# Patient Record
Sex: Female | Born: 1960 | Race: White | Hispanic: No | Smoking: Never smoker
Health system: Southern US, Community
[De-identification: ages and names within clinical notes are randomized; demographics above are authoritative.]

## PROBLEM LIST (undated history)

## (undated) DIAGNOSIS — E785 Hyperlipidemia, unspecified: Secondary | ICD-10-CM

## (undated) DIAGNOSIS — I739 Peripheral vascular disease, unspecified: Secondary | ICD-10-CM

## (undated) DIAGNOSIS — I255 Ischemic cardiomyopathy: Secondary | ICD-10-CM

## (undated) DIAGNOSIS — I779 Disorder of arteries and arterioles, unspecified: Secondary | ICD-10-CM

## (undated) DIAGNOSIS — I10 Essential (primary) hypertension: Secondary | ICD-10-CM

## (undated) DIAGNOSIS — I2510 Atherosclerotic heart disease of native coronary artery without angina pectoris: Secondary | ICD-10-CM

## (undated) HISTORY — PX: TONSILLECTOMY: SUR1361

## (undated) HISTORY — PX: CORONARY ANGIOPLASTY WITH STENT PLACEMENT: SHX49

---

## 1999-02-20 ENCOUNTER — Other Ambulatory Visit: Admission: RE | Admit: 1999-02-20 | Discharge: 1999-02-20 | Payer: Self-pay | Admitting: Obstetrics and Gynecology

## 2000-09-21 ENCOUNTER — Other Ambulatory Visit: Admission: RE | Admit: 2000-09-21 | Discharge: 2000-09-21 | Payer: Self-pay | Admitting: Obstetrics and Gynecology

## 2001-11-02 ENCOUNTER — Other Ambulatory Visit: Admission: RE | Admit: 2001-11-02 | Discharge: 2001-11-02 | Payer: Self-pay | Admitting: *Deleted

## 2001-11-02 ENCOUNTER — Other Ambulatory Visit: Admission: RE | Admit: 2001-11-02 | Discharge: 2001-11-02 | Payer: Self-pay | Admitting: Obstetrics and Gynecology

## 2002-03-09 ENCOUNTER — Inpatient Hospital Stay (HOSPITAL_COMMUNITY): Admission: AD | Admit: 2002-03-09 | Discharge: 2002-03-09 | Payer: Self-pay | Admitting: Obstetrics & Gynecology

## 2002-03-17 ENCOUNTER — Ambulatory Visit (HOSPITAL_COMMUNITY): Admission: RE | Admit: 2002-03-17 | Discharge: 2002-03-17 | Payer: Self-pay | Admitting: Obstetrics and Gynecology

## 2002-03-28 ENCOUNTER — Inpatient Hospital Stay (HOSPITAL_COMMUNITY): Admission: AD | Admit: 2002-03-28 | Discharge: 2002-03-28 | Payer: Self-pay | Admitting: Obstetrics and Gynecology

## 2002-05-19 ENCOUNTER — Inpatient Hospital Stay (HOSPITAL_COMMUNITY): Admission: AD | Admit: 2002-05-19 | Discharge: 2002-05-22 | Payer: Self-pay | Admitting: Obstetrics and Gynecology

## 2002-07-13 ENCOUNTER — Other Ambulatory Visit: Admission: RE | Admit: 2002-07-13 | Discharge: 2002-07-13 | Payer: Self-pay | Admitting: Obstetrics and Gynecology

## 2003-12-28 ENCOUNTER — Other Ambulatory Visit: Admission: RE | Admit: 2003-12-28 | Discharge: 2003-12-28 | Payer: Self-pay | Admitting: Obstetrics and Gynecology

## 2004-08-12 ENCOUNTER — Inpatient Hospital Stay (HOSPITAL_COMMUNITY): Admission: AD | Admit: 2004-08-12 | Discharge: 2004-08-12 | Payer: Self-pay | Admitting: Obstetrics & Gynecology

## 2004-12-01 ENCOUNTER — Encounter: Admission: RE | Admit: 2004-12-01 | Discharge: 2004-12-01 | Payer: Self-pay | Admitting: Family Medicine

## 2005-12-19 ENCOUNTER — Other Ambulatory Visit: Admission: RE | Admit: 2005-12-19 | Discharge: 2005-12-19 | Payer: Self-pay | Admitting: Obstetrics and Gynecology

## 2006-02-05 ENCOUNTER — Ambulatory Visit: Payer: Self-pay | Admitting: Cardiology

## 2006-02-06 ENCOUNTER — Ambulatory Visit: Payer: Self-pay

## 2006-02-25 ENCOUNTER — Encounter: Admission: RE | Admit: 2006-02-25 | Discharge: 2006-05-26 | Payer: Self-pay | Admitting: Cardiology

## 2006-02-25 ENCOUNTER — Ambulatory Visit: Payer: Self-pay | Admitting: Cardiology

## 2006-03-03 ENCOUNTER — Ambulatory Visit: Payer: Self-pay | Admitting: Cardiology

## 2010-09-22 ENCOUNTER — Encounter: Payer: Self-pay | Admitting: Family Medicine

## 2011-01-17 NOTE — H&P (Signed)
   NAME:  Deanna Hogan, CARROL                         ACCOUNT NO.:  [id number]   MEDICAL RECORD NO.:  [id number]                   PATIENT TYPE:   LOCATION:                                       FACILITY:   PHYSICIAN:  Duke Salvia. Marcelle Overlie, M.D.            DATE OF BIRTH:  11/20/1960   DATE OF ADMISSION:  05/19/2002  DATE OF DISCHARGE:                                HISTORY & PHYSICAL   CHIEF COMPLAINT:  Hypertension at term.   HISTORY OF PRESENT ILLNESS:  A 50 year old G4, P2-0-1-2 at 17 and 6/7 weeks  presents for routine visit in the office today.  Was known to have 2+  proteinuria.  BP was 156/96 with 1+ lower extremity edema and a favorable  cervix.  Presents for labor induction.   PRENATAL COURSE:  Blood type A-.  Rubella titer is positive.  She has been  on p.o. 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  Three hour GTT was normal after a one hour GTT was slightly elevated at 144.  AST screen was negative.  Group B Strep screen was negative.   ALLERGIES:  None.   OBSTETRICAL HISTORY:  She has had SAB that required a D&C and two vaginal  deliveries.   FAMILY HISTORY:  Significant for father and grandparents with hypertension,  otherwise unremarkable.   PHYSICAL EXAMINATION:  VITAL SIGNS:  Temperature 98.2, blood pressure  156/96.  HEENT:  Unremarkable.  NECK:  Supple without mass.  LUNGS:  Clear.  CARDIOVASCULAR:  Regular rate and rhythm without murmurs, rubs, or gallops  noted.  BREASTS:  Not examined.  ABDOMEN:  Term fundal height.  Fetal heart rate 140.  PELVIC:  Cervix is 4, 90%, -2, vertex.  EXTREMITIES:  Edema 1-2+.  Reflexes 2+.  No clonus.   IMPRESSION:  1. Term intrauterine pregnancy.  2. Preeclampsia.   PLAN:  AROM induction.  Will obtain some baseline laboratory work and  consider magnesium sulfate also.     Richard M. Marcelle Overlie, M.D.    [facility]/MEDQ  D:  05/19/2002  T:  05/19/2002  Job:  [id number]

## 2011-01-17 NOTE — Discharge Summary (Signed)
NAME:  Deanna Hogan, Deanna Hogan                         ACCOUNT NO.:  [id number]   MEDICAL RECORD NO.:  [id number]                   PATIENT TYPE:  INP   LOCATION:  9146                                 FACILITY:  [facility]   PHYSICIAN:  Juluis Mire, M.D.                DATE OF BIRTH:  Jun 23, 1961   DATE OF ADMISSION:  05/19/2002  DATE OF DISCHARGE:  05/22/2002                                 DISCHARGE SUMMARY   ADMISSION DIAGNOSES:  1. Intrauterine pregnancy at 50 and six-sevenths weeks estimated gestational     age.  2. Pregnancy induced hypertension.   DISCHARGE DIAGNOSES:  1. Status post spontaneous vaginal delivery.  2. Viable female infant.  3. Second degree perineal laceration with repair.   PROCEDURE:  Spontaneous vaginal delivery.   REASON FOR ADMISSION:  Please see dictated H&P.   HOSPITAL COURSE:  The patient was admitted from the office at 71 and six-  sevenths weeks estimated gestational age with 2+ proteinuria and blood  pressure of 156/96.  Cervix was favorable and decision was made to proceed  with induction of labor.  Fetal heart tones were in the 140s with  reactivity.  Labs were drawn which revealed a platelet count of 130,000; AST  44; LDH 333; uric acid 5.5; and creatinine 0.7.  The patient was started on  magnesium sulfate.  Artificial rupture of membranes was performed, revealing  clear fluid.  Fetal heart tones remained reactive.  Pitocin augmentation was  started.  The patient had spontaneous vaginal delivery of a viable female  infant with Apgars of 8 at one minute and 9 at five minutes.  Placenta was  delivered intact.  Estimated blood loss was 400 cc.  A second degree  perineal laceration was repaired.  Magnesium sulfate was continued for 24  hours postpartally.  On postpartum day #1, the patient did not complain of a  headache or visual disturbance.  Blood pressure was 140/90s.  Fundus was  firm.  Lochia was small rubra.  Deep tendon reflexes were 2+, no clonus.  The patient did have 3+ pitting edema in the lower extremities.  PIH labs  were repeated.  Platelet count 110,000; AST 27; LDH 173; uric acid 5.7.  Magnesium sulfate was continued.  On postpartum day #2 the patient did  complain of  a sinus headache.  She denied visual disturbance.  Blood  pressure was 120/70 and other vital signs were stable.  Fundus was firm with  lochia small serosa.  The patient had had good urine output.  Platelets were  increased to 131,000  and magnesium sulfate was discontinued.  On postpartum  day #3 the patient had no symptoms of PIH.  Blood pressure was 140/70.  Vital signs were stable. Fundus was firm.  Deep tendon reflexes were 2+.  The patient was discharged home.   CONDITION ON DISCHARGE:  Good.   DIET:  Regular as tolerated.  ACTIVITY:  As tolerated.   SPECIAL INSTRUCTIONS:  The patient to call for excessive heavy vaginal  bleeding.  The patient also to call for headache unrelieved by Tylenol.   FOLLOW-UP:  The patient is to follow up in the office in two days for a  blood pressure check.  Otherwise, she is to return to the office in four to  six weeks for a postpartum check.   DISCHARGE MEDICATIONS:  1. Prenatal vitamins one p.o. daily.  2. Motrin 600 mg over-the-counter q.6h. p.r.n.     Julio Sicks, NP                          Juluis Mire, M.D.    CC/MEDQ  D:  06/20/2002  T:  06/20/2002  Job:  [id number]

## 2012-10-18 ENCOUNTER — Other Ambulatory Visit: Payer: Self-pay

## 2012-10-18 ENCOUNTER — Encounter (HOSPITAL_COMMUNITY)
Admission: EM | Disposition: A | Discharge status: Home / Self Care | Payer: Self-pay | Source: Other Acute Inpatient Hospital | Attending: Cardiovascular Disease

## 2012-10-18 ENCOUNTER — Inpatient Hospital Stay (HOSPITAL_COMMUNITY): Payer: No Typology Code available for payment source

## 2012-10-18 ENCOUNTER — Inpatient Hospital Stay (HOSPITAL_COMMUNITY)
Admission: EM | Admit: 2012-10-18 | Discharge: 2012-10-22 | DRG: 247 | Disposition: A | Discharge status: Home / Self Care | Payer: No Typology Code available for payment source | Source: Other Acute Inpatient Hospital | Attending: Cardiovascular Disease | Admitting: Cardiovascular Disease

## 2012-10-18 ENCOUNTER — Encounter (HOSPITAL_COMMUNITY): Payer: Self-pay | Admitting: Internal Medicine

## 2012-10-18 DIAGNOSIS — F329 Major depressive disorder, single episode, unspecified: Secondary | ICD-10-CM | POA: Diagnosis present

## 2012-10-18 DIAGNOSIS — Y849 Medical procedure, unspecified as the cause of abnormal reaction of the patient, or of later complication, without mention of misadventure at the time of the procedure: Secondary | ICD-10-CM | POA: Diagnosis not present

## 2012-10-18 DIAGNOSIS — E785 Hyperlipidemia, unspecified: Secondary | ICD-10-CM

## 2012-10-18 DIAGNOSIS — G4440 Drug-induced headache, not elsewhere classified, not intractable: Secondary | ICD-10-CM | POA: Diagnosis not present

## 2012-10-18 DIAGNOSIS — I2510 Atherosclerotic heart disease of native coronary artery without angina pectoris: Secondary | ICD-10-CM

## 2012-10-18 DIAGNOSIS — Z79899 Other long term (current) drug therapy: Secondary | ICD-10-CM

## 2012-10-18 DIAGNOSIS — IMO0002 Reserved for concepts with insufficient information to code with codable children: Secondary | ICD-10-CM | POA: Diagnosis not present

## 2012-10-18 DIAGNOSIS — I2109 ST elevation (STEMI) myocardial infarction involving other coronary artery of anterior wall: Secondary | ICD-10-CM

## 2012-10-18 DIAGNOSIS — Z8249 Family history of ischemic heart disease and other diseases of the circulatory system: Secondary | ICD-10-CM

## 2012-10-18 DIAGNOSIS — I10 Essential (primary) hypertension: Secondary | ICD-10-CM

## 2012-10-18 DIAGNOSIS — F3289 Other specified depressive episodes: Secondary | ICD-10-CM | POA: Diagnosis present

## 2012-10-18 DIAGNOSIS — Y9210 Unspecified residential institution as the place of occurrence of the external cause: Secondary | ICD-10-CM | POA: Diagnosis not present

## 2012-10-18 DIAGNOSIS — T463X5A Adverse effect of coronary vasodilators, initial encounter: Secondary | ICD-10-CM | POA: Diagnosis not present

## 2012-10-18 DIAGNOSIS — I213 ST elevation (STEMI) myocardial infarction of unspecified site: Secondary | ICD-10-CM

## 2012-10-18 DIAGNOSIS — I6529 Occlusion and stenosis of unspecified carotid artery: Secondary | ICD-10-CM | POA: Diagnosis present

## 2012-10-18 DIAGNOSIS — T40605A Adverse effect of unspecified narcotics, initial encounter: Secondary | ICD-10-CM | POA: Diagnosis not present

## 2012-10-18 DIAGNOSIS — I252 Old myocardial infarction: Secondary | ICD-10-CM

## 2012-10-18 DIAGNOSIS — I219 Acute myocardial infarction, unspecified: Secondary | ICD-10-CM

## 2012-10-18 DIAGNOSIS — Z955 Presence of coronary angioplasty implant and graft: Secondary | ICD-10-CM

## 2012-10-18 DIAGNOSIS — L299 Pruritus, unspecified: Secondary | ICD-10-CM | POA: Diagnosis not present

## 2012-10-18 DIAGNOSIS — E876 Hypokalemia: Secondary | ICD-10-CM | POA: Diagnosis present

## 2012-10-18 DIAGNOSIS — I2589 Other forms of chronic ischemic heart disease: Secondary | ICD-10-CM | POA: Diagnosis present

## 2012-10-18 HISTORY — DX: Peripheral vascular disease, unspecified: I73.9

## 2012-10-18 HISTORY — PX: LEFT HEART CATHETERIZATION WITH CORONARY ANGIOGRAM: SHX5451

## 2012-10-18 HISTORY — DX: Disorder of arteries and arterioles, unspecified: I77.9

## 2012-10-18 HISTORY — DX: Hyperlipidemia, unspecified: E78.5

## 2012-10-18 HISTORY — DX: Atherosclerotic heart disease of native coronary artery without angina pectoris: I25.10

## 2012-10-18 HISTORY — DX: Ischemic cardiomyopathy: I25.5

## 2012-10-18 HISTORY — DX: Essential (primary) hypertension: I10

## 2012-10-18 HISTORY — PX: PERCUTANEOUS CORONARY STENT INTERVENTION (PCI-S): SHX5485

## 2012-10-18 LAB — LIPID PANEL
LDL Cholesterol: 110 mg/dL — ABNORMAL HIGH (ref 0–99)
Triglycerides: 49 mg/dL (ref <150)
VLDL: 10 mg/dL (ref 0–40)

## 2012-10-18 LAB — CBC WITH DIFFERENTIAL/PLATELET
Basophils Absolute: 0 10*3/uL (ref 0.0–0.1)
Basophils Relative: 0 % (ref 0–1)
Eosinophils Relative: 0 % (ref 0–5)
HCT: 41.3 % (ref 36.0–46.0)
Hemoglobin: 14.2 g/dL (ref 12.0–15.0)
MCH: 30.7 pg (ref 26.0–34.0)
MCHC: 34.4 g/dL (ref 30.0–36.0)
MCV: 89.4 fL (ref 78.0–100.0)
Monocytes Absolute: 0.3 10*3/uL (ref 0.1–1.0)
Monocytes Relative: 3 % (ref 3–12)
Neutro Abs: 8.5 10*3/uL — ABNORMAL HIGH (ref 1.7–7.7)
RDW: 14.2 % (ref 11.5–15.5)

## 2012-10-18 LAB — CK TOTAL AND CKMB (NOT AT ARMC)
CK, MB: 3.8 ng/mL (ref 0.3–4.0)
Total CK: 144 U/L (ref 7–177)

## 2012-10-18 LAB — COMPREHENSIVE METABOLIC PANEL
ALT: 19 U/L (ref 0–35)
AST: 21 U/L (ref 0–37)
AST: 26 U/L (ref 0–37)
Albumin: 3.7 g/dL (ref 3.5–5.2)
Albumin: 3.6 g/dL (ref 3.5–5.2)
Alkaline Phosphatase: 81 U/L (ref 39–117)
CO2: 27 mEq/L (ref 19–32)
CO2: 26 mEq/L (ref 19–32)
Chloride: 99 mEq/L (ref 96–112)
Creatinine, Ser: 0.69 mg/dL (ref 0.50–1.10)
GFR calc non Af Amer: >90 mL/min (ref >90)
GFR calc non Af Amer: >90 mL/min (ref >90)
Glucose, Bld: 112 mg/dL — ABNORMAL HIGH (ref 70–99)
Potassium: 3.2 mEq/L — ABNORMAL LOW (ref 3.5–5.1)
Total Bilirubin: 0.3 mg/dL (ref 0.3–1.2)
Total Bilirubin: 0.3 mg/dL (ref 0.3–1.2)

## 2012-10-18 LAB — POCT I-STAT, CHEM 8
Glucose, Bld: 113 mg/dL — ABNORMAL HIGH (ref 70–99)
HCT: 44 % (ref 36.0–46.0)
Hemoglobin: 15 g/dL (ref 12.0–15.0)
Potassium: 3.2 mEq/L — ABNORMAL LOW (ref 3.5–5.1)
Sodium: 139 mEq/L (ref 135–145)

## 2012-10-18 LAB — CBC
HCT: 40 % (ref 36.0–46.0)
MCH: 30.8 pg (ref 26.0–34.0)
MCV: 88.7 fL (ref 78.0–100.0)
RDW: 14.1 % (ref 11.5–15.5)
WBC: 7.1 10*3/uL (ref 4.0–10.5)

## 2012-10-18 LAB — MRSA PCR SCREENING: MRSA by PCR: POSITIVE — AB

## 2012-10-18 LAB — POCT ACTIVATED CLOTTING TIME
Activated Clotting Time: 519 seconds
Activated Clotting Time: 192 seconds

## 2012-10-18 LAB — PROTIME-INR: Prothrombin Time: 13 seconds (ref 11.6–15.2)

## 2012-10-18 LAB — TROPONIN I: Troponin I: 0.41 ng/mL (ref <0.30)

## 2012-10-18 SURGERY — LEFT HEART CATHETERIZATION WITH CORONARY ANGIOGRAM
Anesthesia: LOCAL

## 2012-10-18 MED ORDER — BIVALIRUDIN 250 MG IV SOLR
INTRAVENOUS | Status: AC
  Filled 2012-10-18: qty 250

## 2012-10-18 MED ORDER — MIDAZOLAM HCL 2 MG/2ML IJ SOLN
INTRAMUSCULAR | Status: AC
  Filled 2012-10-18: qty 2

## 2012-10-18 MED ORDER — ASPIRIN 81 MG PO CHEW
81.0000 mg | CHEWABLE_TABLET | Freq: Every day | ORAL | Status: DC
  Administered 2012-10-18 – 2012-10-22 (×5): 81 mg via ORAL
  Filled 2012-10-18 (×5): qty 1

## 2012-10-18 MED ORDER — SODIUM CHLORIDE 0.9 % IJ SOLN
3.0000 mL | Freq: Two times a day (BID) | INTRAMUSCULAR | Status: DC
  Administered 2012-10-18 – 2012-10-21 (×6): 3 mL via INTRAVENOUS

## 2012-10-18 MED ORDER — MORPHINE SULFATE 2 MG/ML IJ SOLN
2.0000 mg | INTRAMUSCULAR | Status: DC | PRN
  Administered 2012-10-18: 2 mg via INTRAVENOUS
  Filled 2012-10-18: qty 1

## 2012-10-18 MED ORDER — VERAPAMIL HCL 2.5 MG/ML IV SOLN
INTRAVENOUS | Status: AC
  Filled 2012-10-18: qty 2

## 2012-10-18 MED ORDER — SODIUM CHLORIDE 0.9 % IV SOLN: INTRAVENOUS | Status: DC

## 2012-10-18 MED ORDER — FENTANYL CITRATE 0.05 MG/ML IJ SOLN
INTRAMUSCULAR | Status: AC
  Filled 2012-10-18: qty 2

## 2012-10-18 MED ORDER — DIPHENHYDRAMINE HCL 50 MG/ML IJ SOLN
INTRAMUSCULAR | Status: AC
  Filled 2012-10-18: qty 1

## 2012-10-18 MED ORDER — DIPHENHYDRAMINE HCL 50 MG/ML IJ SOLN
50.0000 mg | Freq: Once | INTRAMUSCULAR | Status: AC
  Administered 2012-10-18: 50 mg via INTRAVENOUS

## 2012-10-18 MED ORDER — NITROGLYCERIN 0.4 MG SL SUBL: 0.4000 mg | SUBLINGUAL_TABLET | SUBLINGUAL | Status: DC | PRN

## 2012-10-18 MED ORDER — HYDROCODONE-ACETAMINOPHEN 5-325 MG PO TABS
1.0000 | ORAL_TABLET | ORAL | Status: DC | PRN
  Administered 2012-10-20 – 2012-10-21 (×3): 1 via ORAL
  Filled 2012-10-18 (×3): qty 1

## 2012-10-18 MED ORDER — CARVEDILOL 3.125 MG PO TABS
3.1250 mg | ORAL_TABLET | Freq: Two times a day (BID) | ORAL | Status: DC
  Administered 2012-10-18 – 2012-10-20 (×4): 3.125 mg via ORAL
  Filled 2012-10-18 (×6): qty 1

## 2012-10-18 MED ORDER — PRASUGREL HCL 10 MG PO TABS
10.0000 mg | ORAL_TABLET | Freq: Every day | ORAL | Status: DC
  Administered 2012-10-19: 10 mg via ORAL
  Filled 2012-10-18: qty 1

## 2012-10-18 MED ORDER — HEPARIN (PORCINE) IN NACL 2-0.9 UNIT/ML-% IJ SOLN
INTRAMUSCULAR | Status: AC
  Filled 2012-10-18: qty 1000

## 2012-10-18 MED ORDER — PRASUGREL HCL 10 MG PO TABS
ORAL_TABLET | ORAL | Status: AC
  Filled 2012-10-18: qty 6

## 2012-10-18 MED ORDER — SODIUM CHLORIDE 0.9 % IV SOLN
0.2500 mg/kg/h | INTRAVENOUS | Status: AC
  Filled 2012-10-18: qty 250

## 2012-10-18 MED ORDER — SODIUM CHLORIDE 0.9 % IV SOLN: 250.0000 mL | INTRAVENOUS | Status: DC | PRN

## 2012-10-18 MED ORDER — ATORVASTATIN CALCIUM 80 MG PO TABS
80.0000 mg | ORAL_TABLET | Freq: Every day | ORAL | Status: DC
  Administered 2012-10-18 – 2012-10-21 (×4): 80 mg via ORAL
  Filled 2012-10-18 (×5): qty 1

## 2012-10-18 MED ORDER — NITROGLYCERIN IN D5W 200-5 MCG/ML-% IV SOLN
2.0000 ug/min | INTRAVENOUS | Status: DC
  Administered 2012-10-18: 10 ug/min via INTRAVENOUS
  Filled 2012-10-18: qty 250

## 2012-10-18 MED ORDER — SODIUM CHLORIDE 0.9 % IJ SOLN: 3.0000 mL | INTRAMUSCULAR | Status: DC | PRN

## 2012-10-18 MED ORDER — POTASSIUM CHLORIDE CRYS ER 20 MEQ PO TBCR
40.0000 meq | EXTENDED_RELEASE_TABLET | Freq: Once | ORAL | Status: AC
  Administered 2012-10-18: 40 meq via ORAL
  Filled 2012-10-18: qty 2

## 2012-10-18 MED ORDER — ASPIRIN EC 81 MG PO TBEC: 81.0000 mg | DELAYED_RELEASE_TABLET | Freq: Every day | ORAL | Status: DC

## 2012-10-18 MED ORDER — ONDANSETRON HCL 4 MG/2ML IJ SOLN: 4.0000 mg | Freq: Four times a day (QID) | INTRAMUSCULAR | Status: DC | PRN

## 2012-10-18 MED ORDER — ACETAMINOPHEN 325 MG PO TABS
650.0000 mg | ORAL_TABLET | ORAL | Status: DC | PRN
  Administered 2012-10-20: 650 mg via ORAL
  Filled 2012-10-18: qty 2

## 2012-10-18 MED ORDER — LIDOCAINE HCL (PF) 1 % IJ SOLN
INTRAMUSCULAR | Status: AC
  Filled 2012-10-18: qty 30

## 2012-10-18 MED ORDER — SODIUM CHLORIDE 0.9 % IJ SOLN
3.0000 mL | Freq: Two times a day (BID) | INTRAMUSCULAR | Status: DC
  Administered 2012-10-18 – 2012-10-21 (×8): 3 mL via INTRAVENOUS

## 2012-10-18 NOTE — Progress Notes (Signed)
Pt continues to have same amount of chest tightness after starting iv ntg.   Pt states she does not want ntg increased.  Because she does not want a headache.  Also refuses iv morphine.

## 2012-10-18 NOTE — CV Procedure (Signed)
Cardiac Catheterization Procedure Note  Name: Deanna Hogan MRN: [id number] DOB: 1961/08/31  Procedure: Left Heart Cath, Selective Coronary Angiography, LV angiography, PTCA and stenting of the LAD  Indication: Anterior MI  Procedural Details:  The right wrist was prepped, draped, and anesthetized with 1% lidocaine. Using the modified Seldinger technique, a 6 French sheath was introduced into the right radial artery. 3 mg of verapamil was administered through the sheath, weight-based unfractionated heparin was administered intravenously. Standard Judkins catheters were used for selective coronary angiography and left ventriculography. Catheter exchanges were performed over an exchange length guidewire. The LCA was imaged with an XB-LAD 3.5 cm guide catheter.  PROCEDURAL FINDINGS Hemodynamics: AO 118/72 LV 112/14   Coronary angiography: Coronary dominance: right  Left mainstem: Widely patent without significant stenosis.  Left anterior descending (LAD): The LAD is severely diseased just after the first septal perforator. The proximal vessel has minor luminal irregularity. The first diagonal is diffusely diseased and it is a small-caliber vessel. The LAD just before the first diagonal origin has a 90% eccentric stenosis. There is diffuse disease throughout the mid LAD extending from the first of the second diagonal. The second diagonal origin is patent. The mid LAD has diffuse 80% stenosis. The distal LAD is relatively small in caliber but it appears smooth and nondiseased.  Left circumflex (LCx): The left circumflex is patent. It supplies a single large obtuse marginal branch. There is no significant stenosis identified.  Right coronary artery (RCA): Widely patent throughout. This is a large, dominant vessel. There are no significant stenoses throughout the proximal, mid, or distal RCA. The PDA branch is patent with moderate distal vessel disease noted. There is a 70% stenosis in the  distal PDA. The posterolateral branches are small with no significant disease. There are total of 3 PLA branches.  Left ventriculography: Left ventricular function is severely depressed. There is akinesis of the entire periapical region. The LVEF is estimated at 35%. There is no significant mitral regurgitation.  PCI Note:  Following the diagnostic procedure, the decision was made to proceed with PCI.  Weight-based bivalirudin was given for anticoagulation. She was loaded with effient 60 mg. Once a therapeutic ACT was achieved, a 6 [place] XB LAD guide catheter was inserted.  A cougar coronary guidewire was used to cross the lesion.  The lesion was predilated with a 2.5 x 20 mm balloon.  The lesion was then stented with a 2.5 x 32 mm promus premier drug-eluting stent.  The stent was postdilated with a 2.75 x 20 mm noncompliant balloon.  There is severe disease of the distal edge of the stent. The stented segment had to be extended with a 2.25 x 16 mm Promus premier drug-eluting stent. Initially the stent would not pass and I postdilated at with a 2.75 mm noncompliant balloon. Following post dilatation I was able to extend the second stent through the first stented segment. The stent was deployed in overlapping fashion. The stent balloon was pulled back and dilated to 18 atmospheres over the overlapped segment. Following PCI, there was 0% residual stenosis and TIMI-3 flow. Final angiography confirmed an excellent result. The patient tolerated the procedure well. There were no immediate procedural complications. A TR band was used for radial hemostasis. The patient was transferred to the post catheterization recovery area for further monitoring.  PCI Data: Vessel - LAD/Segment - mid Percent Stenosis (pre)  90 TIMI-flow 3 Stent 2.5 x 32 mm and 2.25 x 16 mm Promus premier DES Percent Stenosis (  post) 0 TIMI-flow (post) 3  Final Conclusions:   1. 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 Severe segmental LV dysfunction   Recommendations:  The patient will receive dual antiplatelet therapy for at least 12 months. She should receive post MI medical therapy and will be monitored carefully in the cardiac ICU. I would recommend an echocardiogram before she is discharged from the hospital. I am optimistic that her LV dysfunction is related to myocardial stunning because of the short duration of chest pain and TIMI-3 flow on arrival. Hopefully she will have early improvement of LV function.  Tonny Bollman 10/18/2012, 4:12 PM

## 2012-10-18 NOTE — Progress Notes (Signed)
Pt c/o itching all over after the morphine.  Called and notified. Dr. Chase Picket.  Orders received for benadryl.  Also pt c/o headache.  Orders received for lortab.

## 2012-10-18 NOTE — Progress Notes (Signed)
Pt c/o some chest tightness/  ekg done.  Called and notified chris berge.  Also notified of k 3.2.  Ordered received.

## 2012-10-18 NOTE — Progress Notes (Signed)
Utilization Review Completed.[place] T2/17/2014  

## 2012-10-18 NOTE — H&P (Signed)
History and Physical   Patient ID: Deanna Hogan MRN: [id number], DOB/AGE: May 21, 1961 52 y.o. Date of Encounter: 10/18/2012  Primary Physician: Default, Provider, MD Primary Cardiologist:    Chief Complaint: Chest pain  HPI:   Patient is 52 yo lady with PMH HTN and HLD, who was admitted due to chest pain.   Her chest pain started at about one hour ago. She was evaluated in the ER of [facility]. She was found to have ST elevation in V2 to V4 in ER. After treated with ASA, nitroglycerine and bolus heparin, she was transferred to [facility] for urgent cardiac cath. Upon arrival to cath lab, patient is chest pain free, no SOB or palpitation. She is oriented and hemodynamically stable. No pain or swelling in calf areas.  Denies fever, chills, headaches,  Cough, SOB,  abdominal pain,diarrhea, constipation, dysuria, urgency, frequency, hematuria.    Past Medical History  Diagnosis Date  . HTN (hypertension)   . HLD (hyperlipidemia)     Family History  Problem Relation Age of Onset  . Heart attack Father 17    Surgical History: none   I have reviewed the patient's current medications. Prior to Admission medications   Not on File   Scheduled Meds: Continuous Infusions: PRN Meds:.    Allergies: NKA  History   Social History  . Marital Status: Married    Spouse Name: N/A    Number of Children: N/A  . Years of Education: N/A   Occupational History  . Not on file.   Social History Main Topics  . Smoking status: Not on file  . Smokeless tobacco: Not on file  . Alcohol Use: Not on file  . Drug Use: Not on file  . Sexually Active: Not on file   Other Topics Concern  . Not on file   Social History Narrative  . No narrative on file     Family History  Problem Relation Age of Onset  . Heart attack Father 1   No family status information on file.    Review of Systems:   Full 14-point review of systems otherwise negative except as noted  above.  There were no vitals filed for this visit.   Physical Exam:  Vital sign: Bp 150/90 mmHg, HR 68/min  General: resting in bed, not in acute distress HEENT: PERRL, EOMI, no scleral icterus Cardiac: S1/S2, RRR, No murmurs, gallops or rubs Pulm: Good air movement bilaterally, Clear to auscultation bilaterally, No rales, wheezing, rhonchi or rubs. Abd: Soft,  nondistended, nontender, no rebound pain, no organomegaly, BS present Ext: No rashes or edema, 2+DP/PT pulse bilaterally Musculoskeletal: No joint deformities, erythema, or stiffness, ROM full and no nontender Skin: no rashes. No skin bruise. Neuro: alert and oriented X3, cranial nerves II-XII grossly intact, muscle strength 5/5 in all extremeties,  sensation to light touch intact.  Psych.: patient is not psychotic, no suicidal or hemocidal ideation.  ECG: sinus rhythm, regular, normal Axis, nomral R progression, ST elevation in V2 to V4 with reciprocal ST depression in III and aVF.   Assessment and plan:  Patient is 52 yo lady with PMH HTN and HLD, who presents with chest pain. EKG shows ST elevation in V2 to V4 with reciprocal ST depression in III and aVF. She is oriented and hemodynamically stable upon arrival.   #: STEMI:  Patient's chest pain is most likely due to STEMI. She has three risk factors including HTN, HLD and family risk factor (father had heart  attach at age of 5s). Her EKG has ST elevation in precordial leads V2 to V4. Other differential diagnoses includes, but less likely aortic dissection (less likely, no typical tearing chest pain radiating to the back), PE (unlikely, no sign of DVT, Well's score is low probability), pneumothorax (unlikely, no SOB and normal lung sound bilaterally), esophageal perforation (unlikely, no dysphagia or recent hx of endoscopy) and pneumonia (unlikely, no cough, fever or chill).  Plan:  - Admit to CCU and urgent cardiac cath  - Dual antiplatelet mediations after cardiac cath: ASA  and Effient - Nitroglycerin prn, Morphine prn, coreg, Lipitor - Risk factor stratification: A1c, TSH and lipid profile. - Repeat her EKG in the am - she may need 2D echo 2 days after cardiac cath - CXR - Lab: CMP, CBC with diff, troponin  # HTN: She has been on Hyzaar (losartan/HCTZ) at home, but the dosage is not clear.current bp is 150/ 90 mmHg. - may switch to ACEI or Prinzid if renal function is OK  #: HLD: no data about LDL level.  -will be on lipitor -will check lipid profile  #:  DVT PPx: SCD   Lorretta Harp, MD PGY2, Internal Medicine Teaching Service Pager: [phone]  Patient seen, examined. Available data reviewed. Agree with findings, assessment, and plan as outlined by Dr Verdie Mosher. Pt's exam shows a pleasant woman in NAD. Heart is RRR without murmur, lungs are clear, and there is no peripheral edema. Radial pulse is strong with positive Allen's test. EKG shows anterior STEMI. Reviewed risks/indication/alternatives to emergency cath and PCI. She understands and agrees to proceed. Pt consented to Stentys Apposition Trial. Further plans pending cath results.  Tonny Bollman, M.D. 10/18/2012 4:09 PM

## 2012-10-19 ENCOUNTER — Encounter (HOSPITAL_COMMUNITY)
Admission: EM | Disposition: A | Discharge status: Home / Self Care | Payer: Self-pay | Source: Other Acute Inpatient Hospital | Attending: Cardiovascular Disease

## 2012-10-19 DIAGNOSIS — I219 Acute myocardial infarction, unspecified: Secondary | ICD-10-CM

## 2012-10-19 DIAGNOSIS — I2510 Atherosclerotic heart disease of native coronary artery without angina pectoris: Secondary | ICD-10-CM

## 2012-10-19 HISTORY — PX: LEFT HEART CATHETERIZATION WITH CORONARY ANGIOGRAM: SHX5451

## 2012-10-19 LAB — BASIC METABOLIC PANEL
BUN: 7 mg/dL (ref 6–23)
CO2: 30 mEq/L (ref 19–32)
Calcium: 8.8 mg/dL (ref 8.4–10.5)
Glucose, Bld: 80 mg/dL (ref 70–99)
Sodium: 140 mEq/L (ref 135–145)

## 2012-10-19 LAB — CBC
Hemoglobin: 13.3 g/dL (ref 12.0–15.0)
MCH: 30.4 pg (ref 26.0–34.0)
MCHC: 33.9 g/dL (ref 30.0–36.0)
MCV: 89.5 fL (ref 78.0–100.0)
Platelets: 211 10*3/uL (ref 150–400)
RBC: 4.38 MIL/uL (ref 3.87–5.11)

## 2012-10-19 LAB — TROPONIN I: Troponin I: 0.87 ng/mL (ref <0.30)

## 2012-10-19 LAB — HEMOGLOBIN A1C: Hgb A1c MFr Bld: 5.9 % — ABNORMAL HIGH (ref <5.7)

## 2012-10-19 SURGERY — LEFT HEART CATHETERIZATION WITH CORONARY ANGIOGRAM
Anesthesia: LOCAL

## 2012-10-19 MED ORDER — DIAZEPAM 5 MG PO TABS
ORAL_TABLET | ORAL | Status: AC
  Administered 2012-10-19: 10 mg
  Filled 2012-10-19: qty 2

## 2012-10-19 MED ORDER — HEPARIN SODIUM (PORCINE) 1000 UNIT/ML IJ SOLN
INTRAMUSCULAR | Status: AC
  Filled 2012-10-19: qty 1

## 2012-10-19 MED ORDER — SODIUM CHLORIDE 0.9 % IV SOLN: 1.0000 mL/kg/h | INTRAVENOUS | Status: AC

## 2012-10-19 MED ORDER — LOSARTAN POTASSIUM 50 MG PO TABS
50.0000 mg | ORAL_TABLET | Freq: Every day | ORAL | Status: DC
  Administered 2012-10-19 – 2012-10-20 (×2): 50 mg via ORAL
  Filled 2012-10-19 (×2): qty 1

## 2012-10-19 MED ORDER — DESVENLAFAXINE SUCCINATE ER 100 MG PO TB24: 100.0000 mg | ORAL_TABLET | Freq: Every day | ORAL | Status: DC

## 2012-10-19 MED ORDER — SODIUM CHLORIDE 0.9 % IV SOLN: 250.0000 mL | INTRAVENOUS | Status: DC | PRN

## 2012-10-19 MED ORDER — DIAZEPAM 5 MG PO TABS
10.0000 mg | ORAL_TABLET | ORAL | Status: AC
  Administered 2012-10-19: 10 mg via ORAL

## 2012-10-19 MED ORDER — SODIUM CHLORIDE 0.9 % IJ SOLN: 3.0000 mL | INTRAMUSCULAR | Status: DC | PRN

## 2012-10-19 MED ORDER — SODIUM CHLORIDE 0.9 % IV SOLN: 1.0000 mL/kg/h | INTRAVENOUS | Status: DC

## 2012-10-19 MED ORDER — VERAPAMIL HCL 2.5 MG/ML IV SOLN
INTRAVENOUS | Status: AC
  Filled 2012-10-19: qty 2

## 2012-10-19 MED ORDER — CLOPIDOGREL BISULFATE 75 MG PO TABS
75.0000 mg | ORAL_TABLET | Freq: Every day | ORAL | Status: DC
  Administered 2012-10-20 – 2012-10-22 (×3): 75 mg via ORAL
  Filled 2012-10-19 (×4): qty 1

## 2012-10-19 MED ORDER — SODIUM CHLORIDE 0.9 % IJ SOLN: 3.0000 mL | Freq: Two times a day (BID) | INTRAMUSCULAR | Status: DC

## 2012-10-19 MED ORDER — NITROGLYCERIN 1 MG/10 ML FOR IR/CATH LAB
INTRA_ARTERIAL | Status: AC
  Filled 2012-10-19: qty 20

## 2012-10-19 MED ORDER — HEPARIN (PORCINE) IN NACL 2-0.9 UNIT/ML-% IJ SOLN
INTRAMUSCULAR | Status: AC
  Filled 2012-10-19: qty 1000

## 2012-10-19 MED ORDER — MIDAZOLAM HCL 2 MG/2ML IJ SOLN
INTRAMUSCULAR | Status: AC
  Filled 2012-10-19: qty 2

## 2012-10-19 MED ORDER — DESVENLAFAXINE SUCCINATE ER 100 MG PO TB24
100.0000 mg | ORAL_TABLET | Freq: Every day | ORAL | Status: DC
  Administered 2012-10-19 – 2012-10-21 (×3): 100 mg via ORAL
  Filled 2012-10-19: qty 1
  Filled 2012-10-19: qty 2
  Filled 2012-10-19: qty 1
  Filled 2012-10-19 (×2): qty 2

## 2012-10-19 MED ORDER — MAGNESIUM HYDROXIDE 400 MG/5ML PO SUSP
30.0000 mL | Freq: Every day | ORAL | Status: DC | PRN
  Administered 2012-10-19 – 2012-10-21 (×2): 30 mL via ORAL
  Filled 2012-10-19 (×2): qty 30

## 2012-10-19 MED ORDER — POTASSIUM CHLORIDE CRYS ER 20 MEQ PO TBCR
40.0000 meq | EXTENDED_RELEASE_TABLET | ORAL | Status: AC
  Administered 2012-10-19 (×2): 40 meq via ORAL
  Filled 2012-10-19 (×2): qty 1
  Filled 2012-10-19: qty 2

## 2012-10-19 MED ORDER — LIDOCAINE HCL (PF) 1 % IJ SOLN
INTRAMUSCULAR | Status: AC
  Filled 2012-10-19: qty 30

## 2012-10-19 MED ORDER — FENTANYL CITRATE 0.05 MG/ML IJ SOLN
INTRAMUSCULAR | Status: AC
  Filled 2012-10-19: qty 2

## 2012-10-19 MED FILL — Dextrose Inj 5%: INTRAVENOUS | Qty: 50 | Status: AC

## 2012-10-19 NOTE — Interval H&P Note (Signed)
History and Physical Interval Note:  10/19/2012 11:30 AM  Deanna Hogan  has presented today for surgery, with the diagnosis of relook/cp  The various methods of treatment have been discussed with the patient and family. After consideration of risks, benefits and other options for treatment, the patient has consented to  Procedure(s): LEFT HEART CATHETERIZATION WITH CORONARY ANGIOGRAM (N/A) as a surgical intervention .  The patient's history has been reviewed, patient examined, no change in status, stable for surgery.  I have reviewed the patient's chart and labs.  Questions were answered to the patient's satisfaction.     Theron Arista [facility] 10/19/2012 11:30 AM

## 2012-10-19 NOTE — Plan of Care (Signed)
Problem: Phase I Progression Outcomes Goal: Post Cath/PCI return to appropriate Path Outcome: Not Met (add Reason) Patient continues to have chest discomfort post cath/PCI.  Returned to cath lab for evaluation/possible additional PCI.

## 2012-10-19 NOTE — Progress Notes (Signed)
Patient Name: Deanna Hogan Date of Encounter: 10/19/2012     Active Problems:   STEMI (ST elevation myocardial infarction)   HTN (hypertension)   HLD (hyperlipidemia)    SUBJECTIVE  - Patient had some chest tightness last evening. She had itches after received morphine, which resolved after benadryl. Patient also reports mild headache after NG. Currently, she does not have cheat pain, SOB or palpitation. - LDL 93, HDL 51, TSH 2.653, A1c 5.9,  - K=3.0, Cre normal,  - Troponin: 0.41-->0.61 - Tel: sinus rhythm, HR is around 55/min - ECG: Delayed R progression, T inversion in V2 to V6.   CURRENT MEDS Scheduled Meds: . aspirin  81 mg Oral Daily  . atorvastatin  80 mg Oral q1800  . carvedilol  3.125 mg Oral BID WC  . prasugrel  10 mg Oral Daily  . sodium chloride  3 mL Intravenous Q12H  . sodium chloride  3 mL Intravenous Q12H   Continuous Infusions: . nitroGLYCERIN 10 mcg/min (10/18/12 2300)   PRN Meds:.sodium chloride, acetaminophen, HYDROcodone-acetaminophen, morphine injection, nitroGLYCERIN, ondansetron (ZOFRAN) IV, sodium chloride  OBJECTIVE  Filed Vitals:   10/19/12 0400 10/19/12 0407 10/19/12 0500 10/19/12 0600  BP: 86/45 99/49 113/67 128/86  Pulse: 59 61 54 55  Temp: 97.6 F (36.4 C)     Resp: 17 17 12 13   Height:      Weight:  194 lb 10.7 oz (88.3 kg)    SpO2: 95% 97% 99% 99%    Intake/Output Summary (Last 24 hours) at 10/19/12 0709 Last data filed at 10/18/12 2300  Gross per 24 hour  Intake 743.05 ml  Output      0 ml  Net 743.05 ml   CVP:  Filed Weights   10/18/12 1500 10/18/12 1630 10/19/12 0407  Weight: 196 lb 3.4 oz (89 kg) 198 lb 6.6 oz (90 kg) 194 lb 10.7 oz (88.3 kg)    PHYSICAL EXAM  General: Pleasant, NAD. Neuro: Alert and oriented X 3. Moves all extremities spontaneously. Psych: Normal affect. HEENT:  Normal  Neck: Supple without bruits or JVD. Lungs:  Resp regular and unlabored, CTA. Heart: RRR no s3, s4, or  murmurs. Abdomen: Soft, non-tender, non-distended, BS+ Extremities: No clubbing, cyanosis or edema. DP/PT/Radials 2+ and equal bilaterally.  Accessory Clinical Findings  CBC  Recent Labs  10/18/12 1510 10/18/12 1722 10/19/12 0520  WBC  --  10.7* 6.5  NEUTROABS  --  8.5*  --   HGB 15.0 14.2 13.3  HCT 44.0 41.3 39.2  MCV  --  89.4 89.5  PLT  --  213 211   Basic Metabolic Panel  Recent Labs  10/18/12 1510 10/18/12 1722 10/19/12 0520  NA 139 138 140  K 3.2* 3.2* 3.0*  CL 98 99 101  CO2  --  26 30  GLUCOSE 113* 83 80  BUN 7 9 7   CREATININE 0.90 0.69 0.82  CALCIUM  --  9.2 8.8  MG  --  1.9  --   PHOS  --  3.6  --      Recent Labs Lab 10/18/12 1505 10/18/12 1510 10/18/12 1722 10/19/12 0520  NA 137 139 138 140  K 3.1* 3.2* 3.2* 3.0*  CL 98 98 99 101  CO2 27  --  26 30  GLUCOSE 112* 113* 83 80  BUN 9 7 9 7   CREATININE 0.71 0.90 0.69 0.82  CALCIUM 9.0  --  9.2 8.8  MG  --   --  1.9  --  PHOS  --   --  3.6  --      Liver Function Tests  Recent Labs  10/18/12 1505 10/18/12 1722  AST 21 26  ALT 18 19  ALKPHOS 81 81  BILITOT 0.3 0.3  PROT 6.9 6.8  ALBUMIN 3.7 3.6   No results found for this basename: LIPASE, AMYLASE,  in the last 72 hours Cardiac Enzymes  Recent Labs  10/18/12 1505 10/18/12 1722  CKTOTAL 144  --   CKMB 3.8  --   TROPONINI 0.41* 0.61*   BNP No components found with this basename: POCBNP,  D-Dimer No results found for this basename: DDIMER,  in the last 72 hours Hemoglobin A1C  Recent Labs  10/18/12 1505  HGBA1C 5.9*   Fasting Lipid Panel  Recent Labs  10/19/12 0520  CHOL 173  HDL 51  LDLCALC 93  TRIG 144  CHOLHDL 3.4   Thyroid Function Tests  Recent Labs  10/18/12 1722  TSH 2.653    TELE: sinus rhythm, HR around 55/min  ECG: sinus rhythm, regular, normal Axis, delayed R progression, T inversion in V2 to V6.   2D-Echo: none  Radiology/Studies Portable Chest 1 View  10/18/2012  *RADIOLOGY REPORT*   Clinical Data: Chest pain  PORTABLE CHEST - 1 VIEW  Comparison: None.  Findings: Normal heart size.  Coronary artery stent is noted. Negative for heart failure.  Lungs are clear without infiltrate or effusion.  IMPRESSION: No acute abnormality.   Original Report Authenticated By: Janeece Riggers, M.D.    ASSESSMENT AND PLAN  Patient is 52 yo lady with PMH HTN and HLD, who was admitted for acute anterior wall myocardial infarction with severe LAD stenosis, is s/ps of drug-eluting stents. She has severe segmental LV dysfunction   #: STEMI:  Cardiac cath showed severe LAD stenosis and nonobstructive disease of the RCA and left circumflex with wide patency of both vessels. She also had severe segmental LV dysfunction which was believed to be related to myocardial stunning due to short duration of chest pain and TIMI-3 flow on arrival and is expected to have early improvement of LV function. Currently she is chest pain free.  - Continue Nitroglycerin prn, Morphine prn, coreg, Lipitor - Continue ASA and Effient ASA for one year - May add low dose of lisinopril - 2D echo tomorrow -    Recent Labs Lab 10/18/12 1505 10/18/12 1722  TROPONINI 0.41* 0.61*   # HTN: bp is 128/86 mmHg. HR is 55/min. She has been on Hyzaar (losartan/HCTZ) at home.  - Continue Coreg 3.125 mg bid - May switch to low dose lisinopril today ( Cre is normal)  #: HLD: LDL 93, HDL 51. Her LDL goal is ideally < 70. LFT is normal.   -continue Lipitor 80 mg daily.  # Hypokalemia: K=3.0. Mg is 1.9  -will replete K, 40 mEq KCl X 2 -follow up BMP  # Depression: will continue home medication, desvenlafaxine (PRISTIQ) 100 MG 24 hr tablet   #:  DVT PPx: SCD   Lorretta Harp, MD PGY2, Internal Medicine Teaching Service Pager: [phone]  Patient seen, examined. Available data reviewed. Agree with findings, assessment, and plan as outlined by Dr Verdie Mosher. Since his evaluation this am, the patient has developed recurrent chest tightness  just like the pain of her MI. EKG shows evolving changes of anterior MI without new ST elevation. She is back on IV NTG, still complaining of 4/10 chest tightness. Symptoms recurred this am. I have recommended that she go  back to the cath lab for relook cath to rule out sidebranch occlusion or signs of stent thrombosis. She understands the risks, indication of cath and possible PCI and agrees to proceed. Will keep in CCU and continue same meds for now.  Tonny Bollman, M.D. 10/19/2012 10:47 AM

## 2012-10-19 NOTE — Progress Notes (Addendum)
Pt. c/o feeling fatigued with difficulty taking deep breaths similar to symptoms she felt yesterday prior to stent placement.  Rates discomfort at 4/10.  Nitroglycerin gtt restarted at 58mcg/min.  Discomfort post gtt initiation decreased to 1/10.

## 2012-10-19 NOTE — CV Procedure (Signed)
   Cardiac Catheterization Procedure Note  Name: Deanna Hogan MRN: [id number] DOB: 05-22-1961  Procedure: Selective Coronary Angiography  Indication: 52 year old white female status post anterior myocardial infarction. She underwent stenting of the proximal to mid LAD with drug-eluting stents. Patient had recurrent chest pain today similar to her myocardial infarction pain.   Procedural Details: The right wrist was prepped, draped, and anesthetized with 1% lidocaine. Using the modified Seldinger technique, a 5 French sheath was introduced into the right radial artery. 3 mg of verapamil was administered through the sheath, weight-based unfractionated heparin was administered intravenously. Standard Judkins catheters were used for selective coronary angiography. Catheter exchanges were performed over an exchange length guidewire. There were no immediate procedural complications. A TR band was used for radial hemostasis at the completion of the procedure.  The patient was transferred to the post catheterization recovery area for further monitoring.  Procedural Findings: Hemodynamics: AO 109/79 with a mean of 92 mmHg   Coronary angiography: Coronary dominance: right  Left mainstem: Normal.  Left anterior descending (LAD): The LAD is widely patent in the proximal to mid vessel. The stents are widely patent and both diagonal branches are without compromise. In the mid to distal LAD there is a long segment of diffuse narrowing up to 70-80%. There is TIMI 3 flow. No intimal flaps or dye hangup are noted.  Left circumflex (LCx): Mild irregularities less than 20%.  Right coronary artery (RCA): Dominant vessel  is normal.    Final Conclusions:   1. Single vessel coronary disease. The stents in the proximal to mid LAD are widely patent. There is a long segment of narrowing in the mid to distal LAD that is new. The appearance would be consistent with intramural hematoma.  Recommendations: We'll  manage medically. Continue IV nitroglycerin and analgesics. Switch Effient to Plavix.  Purl Claytor [place] MD,FACC  10/19/2012, 12:02 PM

## 2012-10-19 NOTE — Progress Notes (Signed)
Brief progression note  Patient was allergic to Lisinopril in the past, which caused cough. Will not start ACEI for her HTN. Will start Losartan 50 mg daily.   Lorretta Harp, MD PGY2, Internal Medicine Teaching Service Pager: [phone]

## 2012-10-19 NOTE — H&P (View-Only) (Signed)
 Patient Name: Deanna Hogan Date of Encounter: 10/19/2012     Active Problems:   STEMI (ST elevation myocardial infarction)   HTN (hypertension)   HLD (hyperlipidemia)    SUBJECTIVE  - Patient had some chest tightness last evening. She had itches after received morphine, which resolved after benadryl. Patient also reports mild headache after NG. Currently, she does not have cheat pain, SOB or palpitation. - LDL 93, HDL 51, TSH 2.653, A1c 5.9,  - K=3.0, Cre normal,  - Troponin: 0.41-->0.61 - Tel: sinus rhythm, HR is around 55/min - ECG: Delayed R progression, T inversion in V2 to V6.   CURRENT MEDS Scheduled Meds: . aspirin  81 mg Oral Daily  . atorvastatin  80 mg Oral q1800  . carvedilol  3.125 mg Oral BID WC  . prasugrel  10 mg Oral Daily  . sodium chloride  3 mL Intravenous Q12H  . sodium chloride  3 mL Intravenous Q12H   Continuous Infusions: . nitroGLYCERIN 10 mcg/min (10/18/12 2300)   PRN Meds:.sodium chloride, acetaminophen, HYDROcodone-acetaminophen, morphine injection, nitroGLYCERIN, ondansetron (ZOFRAN) IV, sodium chloride  OBJECTIVE  Filed Vitals:   10/19/12 0400 10/19/12 0407 10/19/12 0500 10/19/12 0600  BP: 86/45 99/49 113/67 128/86  Pulse: 59 61 54 55  Temp: 97.6 F (36.4 C)     Resp: 17 17 12 13  Height:      Weight:  194 lb 10.7 oz (88.3 kg)    SpO2: 95% 97% 99% 99%    Intake/Output Summary (Last 24 hours) at 10/19/12 0709 Last data filed at 10/18/12 2300  Gross per 24 hour  Intake 743.05 ml  Output      0 ml  Net 743.05 ml   CVP:  Filed Weights   10/18/12 1500 10/18/12 1630 10/19/12 0407  Weight: 196 lb 3.4 oz (89 kg) 198 lb 6.6 oz (90 kg) 194 lb 10.7 oz (88.3 kg)    PHYSICAL EXAM  General: Pleasant, NAD. Neuro: Alert and oriented X 3. Moves all extremities spontaneously. Psych: Normal affect. HEENT:  Normal  Neck: Supple without bruits or JVD. Lungs:  Resp regular and unlabored, CTA. Heart: RRR no s3, s4, or  murmurs. Abdomen: Soft, non-tender, non-distended, BS+ Extremities: No clubbing, cyanosis or edema. DP/PT/Radials 2+ and equal bilaterally.  Accessory Clinical Findings  CBC  Recent Labs  10/18/12 1510 10/18/12 1722 10/19/12 0520  WBC  --  10.7* 6.5  NEUTROABS  --  8.5*  --   HGB 15.0 14.2 13.3  HCT 44.0 41.3 39.2  MCV  --  89.4 89.5  PLT  --  213 211   Basic Metabolic Panel  Recent Labs  10/18/12 1510 10/18/12 1722 10/19/12 0520  NA 139 138 140  K 3.2* 3.2* 3.0*  CL 98 99 101  CO2  --  26 30  GLUCOSE 113* 83 80  BUN 7 9 7  CREATININE 0.90 0.69 0.82  CALCIUM  --  9.2 8.8  MG  --  1.9  --   PHOS  --  3.6  --      Recent Labs Lab 10/18/12 1505 10/18/12 1510 10/18/12 1722 10/19/12 0520  NA 137 139 138 140  K 3.1* 3.2* 3.2* 3.0*  CL 98 98 99 101  CO2 27  --  26 30  GLUCOSE 112* 113* 83 80  BUN 9 7 9 7  CREATININE 0.71 0.90 0.69 0.82  CALCIUM 9.0  --  9.2 8.8  MG  --   --  1.9  --     PHOS  --   --  3.6  --      Liver Function Tests  Recent Labs  10/18/12 1505 10/18/12 1722  AST 21 26  ALT 18 19  ALKPHOS 81 81  BILITOT 0.3 0.3  PROT 6.9 6.8  ALBUMIN 3.7 3.6   No results found for this basename: LIPASE, AMYLASE,  in the last 72 hours Cardiac Enzymes  Recent Labs  10/18/12 1505 10/18/12 1722  CKTOTAL 144  --   CKMB 3.8  --   TROPONINI 0.41* 0.61*   BNP No components found with this basename: POCBNP,  D-Dimer No results found for this basename: DDIMER,  in the last 72 hours Hemoglobin A1C  Recent Labs  10/18/12 1505  HGBA1C 5.9*   Fasting Lipid Panel  Recent Labs  10/19/12 0520  CHOL 173  HDL 51  LDLCALC 93  TRIG 144  CHOLHDL 3.4   Thyroid Function Tests  Recent Labs  10/18/12 1722  TSH 2.653    TELE: sinus rhythm, HR around 55/min  ECG: sinus rhythm, regular, normal Axis, delayed R progression, T inversion in V2 to V6.   2D-Echo: none  Radiology/Studies Portable Chest 1 View  10/18/2012  *RADIOLOGY REPORT*   Clinical Data: Chest pain  PORTABLE CHEST - 1 VIEW  Comparison: None.  Findings: Normal heart size.  Coronary artery stent is noted. Negative for heart failure.  Lungs are clear without infiltrate or effusion.  IMPRESSION: No acute abnormality.   Original Report Authenticated By: David Clark, M.D.    ASSESSMENT AND PLAN  Patient is 51 yo lady with PMH HTN and HLD, who was admitted for acute anterior wall myocardial infarction with severe LAD stenosis, is s/ps of drug-eluting stents. She has severe segmental LV dysfunction   #: STEMI:  Cardiac cath showed severe LAD stenosis and nonobstructive disease of the RCA and left circumflex with wide patency of both vessels. She also had severe segmental LV dysfunction which was believed to be related to myocardial stunning due to short duration of chest pain and TIMI-3 flow on arrival and is expected to have early improvement of LV function. Currently she is chest pain free.  - Continue Nitroglycerin prn, Morphine prn, coreg, Lipitor - Continue ASA and Effient ASA for one year - May add low dose of lisinopril - 2D echo tomorrow -    Recent Labs Lab 10/18/12 1505 10/18/12 1722  TROPONINI 0.41* 0.61*   # HTN: bp is 128/86 mmHg. HR is 55/min. She has been on Hyzaar (losartan/HCTZ) at home.  - Continue Coreg 3.125 mg bid - May switch to low dose lisinopril today ( Cre is normal)  #: HLD: LDL 93, HDL 51. Her LDL goal is ideally < 70. LFT is normal.   -continue Lipitor 80 mg daily.  # Hypokalemia: K=3.0. Mg is 1.9  -will replete K, 40 mEq KCl X 2 -follow up BMP  # Depression: will continue home medication, desvenlafaxine (PRISTIQ) 100 MG 24 hr tablet   #:  DVT PPx: SCD   Xilin Niu, MD PGY2, Internal Medicine Teaching Service Pager: [phone]  Patient seen, examined. Available data reviewed. Agree with findings, assessment, and plan as outlined by Dr Liu. Since his evaluation this am, the patient has developed recurrent chest tightness  just like the pain of her MI. EKG shows evolving changes of anterior MI without new ST elevation. She is back on IV NTG, still complaining of 4/10 chest tightness. Symptoms recurred this am. I have recommended that she go   back to the cath lab for relook cath to rule out sidebranch occlusion or signs of stent thrombosis. She understands the risks, indication of cath and possible PCI and agrees to proceed. Will keep in CCU and continue same meds for now.  Ronan Dion, M.D. 10/19/2012 10:47 AM   

## 2012-10-20 LAB — BASIC METABOLIC PANEL
BUN: 7 mg/dL (ref 6–23)
Chloride: 106 mEq/L (ref 96–112)
GFR calc Af Amer: >90 mL/min (ref >90)
GFR calc non Af Amer: >90 mL/min (ref >90)
Potassium: 3.9 mEq/L (ref 3.5–5.1)
Sodium: 138 mEq/L (ref 135–145)

## 2012-10-20 LAB — TROPONIN I: Troponin I: 1.07 ng/mL (ref <0.30)

## 2012-10-20 MED ORDER — CARVEDILOL 6.25 MG PO TABS
6.2500 mg | ORAL_TABLET | Freq: Two times a day (BID) | ORAL | Status: DC
  Administered 2012-10-20 – 2012-10-22 (×4): 6.25 mg via ORAL
  Filled 2012-10-20 (×6): qty 1

## 2012-10-20 MED ORDER — ISOSORBIDE MONONITRATE ER 30 MG PO TB24
30.0000 mg | ORAL_TABLET | Freq: Every day | ORAL | Status: DC
  Administered 2012-10-20 – 2012-10-21 (×2): 30 mg via ORAL
  Filled 2012-10-20 (×2): qty 1

## 2012-10-20 MED ORDER — SODIUM CHLORIDE 0.9 % IV SOLN: INTRAVENOUS | Status: DC

## 2012-10-20 MED ORDER — MUPIROCIN 2 % EX OINT
1.0000 "application " | TOPICAL_OINTMENT | Freq: Two times a day (BID) | CUTANEOUS | Status: DC
  Administered 2012-10-20 – 2012-10-22 (×5): 1 via NASAL
  Filled 2012-10-20: qty 22

## 2012-10-20 MED ORDER — CHLORHEXIDINE GLUCONATE CLOTH 2 % EX PADS
6.0000 | MEDICATED_PAD | Freq: Every day | CUTANEOUS | Status: DC
  Administered 2012-10-22: 6 via TOPICAL

## 2012-10-20 NOTE — Progress Notes (Signed)
CARDIAC REHAB PHASE I   PRE:  Rate/Rhythm: 74SR  BP:  Supine:   Sitting: 124/81  Standing:    SaO2:   MODE:  Ambulation: 700 ft   POST:  Rate/Rhythem: 96SR  BP:  Supine:   Sitting: 98/71  Standing:    SaO2:  1325-1448 Pt walked 700 ft on RA with steady gait. Pt had felt a little spasm-like discomfort before walk that did not worsen with activity. Pt did c/o dizzy spell after walking about 550 ft. BP lower when we returned at 98/71. Pt had not eaten lunch. Encouraged her to eat. Education on NTG use, MI,stent, risk factors and diet done. Discussed CRP 2 and pt gave permission to refer to [place]. Will complete ed tomorrow. Encouraged pt to walk with RN later this evening.  Duanne Limerick

## 2012-10-20 NOTE — Progress Notes (Signed)
Pt transferred to [id number]; report given to Selena Batten, Charity fundraiser; emotional support given

## 2012-10-20 NOTE — Progress Notes (Signed)
Chaplain Note:  Provided support to husband who arrived after cath completed.  I assisted him with connecting to Cath Lab to receive information on wife's condition and to locate her room. Will follow up.    Dyke Maes [phone]

## 2012-10-20 NOTE — Progress Notes (Addendum)
Per order, pt ambulated in hallway about 300 feet on monitor with RN; pt reported slight pressure in chest while walking; MD Bensimhon aware; no new orders; pt walked back to room and sitting in recliner; no pain at this time; will continue to monitor closely and update as needed

## 2012-10-20 NOTE — Research (Addendum)
APPOSITION V Informed Consent   Subject Name: Deanna Hogan  Subject met inclusion and exclusion criteria.  The informed consent form, study requirements and expectations were reviewed with the subject and questions and concerns were addressed prior to the signing of the consent form.  The subject verbalized understanding of the trial requirements.  The subject agreed to participate in the APPOSITION V trial and signed the informed consent.  The informed consent was obtained prior to performance of any protocol-specific procedures for the subject.  A copy of the signed informed consent was given to the subject and a copy was placed in the subject's medical record.  Consent was obtained at 1455 on 10/18/2012.  Claire Shown 10/20/2012, 10:27 AM

## 2012-10-20 NOTE — Progress Notes (Signed)
Patient Name: Deanna Hogan Date of Encounter: 10/20/2012     Active Problems:   STEMI (ST elevation myocardial infarction)   HTN (hypertension)   HLD (hyperlipidemia)    SUBJECTIVE   --Admitted 2/17 with anterior STEMI -> PCI LAD. EF 35% Pk troponin 1.34 ---Re-cath'ed 2/18 for persistent CP  The stents in the proximal to mid LAD are widely patent. There is a long segment of narrowing in the mid to distal LAD that is new and consistent with intramural hematoma. - Mild chest pain (1/10 in severity) at about 5:45 AM. Currently, she does not cough, SOB or palpitation. No fever or chills.  - Switched Effient to Plavix. - Started Losartan (patient is allergic to lisinopril which caused cough). Bp 109/76 mmHg, HR around 60/min - K=3.9, Cre 0.67 - Troponin: 0.41-->0.61-->1.34-->0.87-->1.07 at 0:05 AM - Tel: sinus rhythm, HR is around 60/min  CURRENT MEDS Scheduled Meds: . aspirin  81 mg Oral Daily  . atorvastatin  80 mg Oral q1800  . carvedilol  3.125 mg Oral BID WC  . clopidogrel  75 mg Oral Q breakfast  . desvenlafaxine  100 mg Oral QHS  . losartan  50 mg Oral Daily  . sodium chloride  3 mL Intravenous Q12H  . sodium chloride  3 mL Intravenous Q12H   Continuous Infusions: . nitroGLYCERIN 10 mcg/min (10/20/12 0500)   PRN Meds:.acetaminophen, HYDROcodone-acetaminophen, magnesium hydroxide, morphine injection, nitroGLYCERIN, ondansetron (ZOFRAN) IV, sodium chloride  OBJECTIVE  Filed Vitals:   10/20/12 0300 10/20/12 0400 10/20/12 0403 10/20/12 0500  BP: 117/67 90/35 103/57 109/76  Pulse: 66 75 72 62  Temp:      TempSrc:      Resp:      Height:      Weight:  205 lb 7.5 oz (93.2 kg)    SpO2: 100% 96% 96% 97%    Intake/Output Summary (Last 24 hours) at 10/20/12 0603 Last data filed at 10/20/12 0500  Gross per 24 hour  Intake 1075.55 ml  Output      0 ml  Net 1075.55 ml   CVP:  Filed Weights   10/18/12 1630 10/19/12 0407 10/20/12 0400  Weight: 198 lb 6.6 oz (90  kg) 194 lb 10.7 oz (88.3 kg) 205 lb 7.5 oz (93.2 kg)    PHYSICAL EXAM  General: Pleasant, NAD. Neuro: Alert and oriented X 3. Moves all extremities spontaneously. Psych: Normal affect. HEENT:  Normal  Neck: Supple without bruits or JVD. Lungs:  Resp regular and unlabored, CTA. Heart: RRR no s3, s4, or murmurs. Abdomen: Soft, non-tender, non-distended, BS+ Extremities: No clubbing, cyanosis or edema. DP/PT/Radials 2+ and equal bilaterally.  Accessory Clinical Findings  CBC  Recent Labs  10/18/12 1510 10/18/12 1722 10/19/12 0520  WBC  --  10.7* 6.5  NEUTROABS  --  8.5*  --   HGB 15.0 14.2 13.3  HCT 44.0 41.3 39.2  MCV  --  89.4 89.5  PLT  --  213 211    Recent Labs Lab 10/18/12 1505 10/18/12 1510 10/18/12 1722 10/19/12 0520 10/20/12 0455  NA 137 139 138 140 138  K 3.1* 3.2* 3.2* 3.0* 3.9  CL 98 98 99 101 106  CO2 27  --  26 30 25   GLUCOSE 112* 113* 83 80 104*  BUN 9 7 9 7 7   CREATININE 0.71 0.90 0.69 0.82 0.67  CALCIUM 9.0  --  9.2 8.8 8.4  MG  --   --  1.9  --   --  PHOS  --   --  3.6  --   --    Liver Function Tests  Recent Labs  10/18/12 1505 10/18/12 1722  AST 21 26  ALT 18 19  ALKPHOS 81 81  BILITOT 0.3 0.3  PROT 6.9 6.8  ALBUMIN 3.7 3.6   No results found for this basename: LIPASE, AMYLASE,  in the last 72 hours Cardiac Enzymes  Recent Labs  10/18/12 1505  10/19/12 1417 10/19/12 1831 10/20/12 0005  CKTOTAL 144  --   --   --   --   CKMB 3.8  --   --   --   --   TROPONINI 0.41*  < > 1.34* 0.87* 1.07*  < > = values in this interval not displayed. BNP No components found with this basename: POCBNP,  D-Dimer No results found for this basename: DDIMER,  in the last 72 hours Hemoglobin A1C  Recent Labs  10/19/12 0520  HGBA1C 5.9*   Fasting Lipid Panel  Recent Labs  10/19/12 0520  CHOL 173  HDL 51  LDLCALC 93  TRIG 144  CHOLHDL 3.4   Thyroid Function Tests  Recent Labs  10/18/12 1722  TSH 2.653    TELE: sinus  rhythm, HR around 60/min  EKG: non new EKG today ECG on 10/19/12: sinus rhythm, regular, normal Axis, delayed R progression, T inversion in V2 to V6.   2D-Echo: none  Radiology/Studies Portable Chest 1 View  10/18/2012  *RADIOLOGY REPORT*  Clinical Data: Chest pain  PORTABLE CHEST - 1 VIEW  Comparison: None.  Findings: Normal heart size.  Coronary artery stent is noted. Negative for heart failure.  Lungs are clear without infiltrate or effusion.  IMPRESSION: No acute abnormality.   Original Report Authenticated By: Janeece Riggers, M.D.    ASSESSMENT AND PLAN  Patient is 52 yo lady with PMH HTN and HLD, who was admitted for acute anterior wall myocardial infarction with severe LAD stenosis, is s/ps of drug-eluting stents. She has severe segmental LV dysfunction   #: STEMI: Intial cardiac cath showed severe LAD stenosis and nonobstructive disease of the RCA and left circumflex with wide patency of both vessels. She also had severe segmental LV dysfunction. Patient was re-cath'ed due to persistent chest pain yesterdy, The stents are widely patent. There is a long segment of narrowing in the mid to distal LAD that is new and consistent with intramural hematoma. Switched Effient to Plavix. Troponin slightly trending up 0.41-->-->1.07. Patient did not have chest pain last night until early morning. Currently, she has 1/10 chest pain and chest pressure. She is on Nitro gtt.   - Continue Nitroglycerin gtt - continue Morphine prn, Coreg and Lipitor - Continue ASA and Plavix for one year - Started low doses of Losartan 50 mg daily on 10/19/12 - Consider 2D echo - May consider doppler to evaluate renal arteries and carotid arteries - May consider to check ANA and ANCA to rule out vasculitis.    Recent Labs Lab 10/18/12 1505 10/18/12 1722 10/19/12 1417 10/19/12 1831 10/20/12 0005  TROPONINI 0.41* 0.61* 1.34* 0.87* 1.07*   # HTN: bp is 106/63 mmHg. HR is 60/min. She has been on Hyzaar  (losartan/HCTZ) at home.  - Continue Coreg 3.125 mg bid - Started losartan 50 mg daily ( Cre is normal)  #: HLD: LDL 93, HDL 51. Her LDL goal is ideally < 70. LFT is normal.   -Continue Lipitor 80 mg daily.  # Hypokalemia: resolved.  K=3.9.   -follow up  BMP  # Depression: will continue home medication, desvenlafaxine (PRISTIQ) 100 MG 24 hr tablet   #:  DVT PPx: SCD   Lorretta Harp, MD PGY2, Internal Medicine Teaching Service Pager: [phone]   Patient seen and examined with Dr. Zane Herald We discussed all aspects of the encounter. I agree with the assessment and plan as stated above.   She appears to have STEMI complicated by possible spontaneous coronary dissection with intramural hematoma. CP now resolving. Pain free now. Will transfer to SDU. Would keep in hospital at least another 48 hours. Treat with Imdur, b-blocker and statin. Given low rise in troponin, EF will likely normalize. Consult CR.  Jaia Alonge,MD 9:50 AM

## 2012-10-21 ENCOUNTER — Encounter (HOSPITAL_COMMUNITY): Payer: Self-pay | Admitting: *Deleted

## 2012-10-21 DIAGNOSIS — I517 Cardiomegaly: Secondary | ICD-10-CM

## 2012-10-21 DIAGNOSIS — R0989 Other specified symptoms and signs involving the circulatory and respiratory systems: Secondary | ICD-10-CM

## 2012-10-21 DIAGNOSIS — I2510 Atherosclerotic heart disease of native coronary artery without angina pectoris: Secondary | ICD-10-CM

## 2012-10-21 LAB — BASIC METABOLIC PANEL
CO2: 26 mEq/L (ref 19–32)
Chloride: 104 mEq/L (ref 96–112)
Creatinine, Ser: 0.79 mg/dL (ref 0.50–1.10)
GFR calc Af Amer: >90 mL/min (ref >90)
Potassium: 4.1 mEq/L (ref 3.5–5.1)
Sodium: 139 mEq/L (ref 135–145)

## 2012-10-21 MED ORDER — LIDOCAINE HCL 2 % EX GEL
CUTANEOUS | Status: AC
  Filled 2012-10-21: qty 40

## 2012-10-21 MED ORDER — FENTANYL CITRATE 0.05 MG/ML IJ SOLN
25.0000 ug | INTRAMUSCULAR | Status: DC | PRN
  Administered 2012-10-21: 25 ug via INTRAVENOUS
  Filled 2012-10-21: qty 2

## 2012-10-21 MED ORDER — LOSARTAN POTASSIUM 25 MG PO TABS
25.0000 mg | ORAL_TABLET | Freq: Every day | ORAL | Status: DC
  Administered 2012-10-21 – 2012-10-22 (×2): 25 mg via ORAL
  Filled 2012-10-21 (×2): qty 1

## 2012-10-21 NOTE — Progress Notes (Signed)
Patient Name: Deanna Hogan Date of Encounter: 10/21/2012   Principal Problem:   STEMI (ST elevation myocardial infarction) Active Problems:   CAD (coronary artery disease)   HTN (hypertension)   HLD (hyperlipidemia)    SUBJECTIVE  No chest pain or sob.  Ambulated last night w/o difficulty.  CURRENT MEDS . aspirin  81 mg Oral Daily  . atorvastatin  80 mg Oral q1800  . carvedilol  6.25 mg Oral BID WC  . Chlorhexidine Gluconate Cloth  6 each Topical Q0600  . clopidogrel  75 mg Oral Q breakfast  . desvenlafaxine  100 mg Oral QHS  . isosorbide mononitrate  30 mg Oral Daily  . mupirocin ointment  1 application Nasal BID  . sodium chloride  3 mL Intravenous Q12H  . sodium chloride  3 mL Intravenous Q12H    OBJECTIVE  Filed Vitals:   10/20/12 2331 10/21/12 0309 10/21/12 0500 10/21/12 0745  BP: 130/80 125/67  121/79  Pulse:    61  Temp: 97.7 F (36.5 C) 98.1 F (36.7 C)  97.8 F (36.6 C)  TempSrc: Oral Oral  Oral  Resp: 15 16  18   Height:      Weight:   195 lb 1.7 oz (88.5 kg)   SpO2: 98% 97%  94%    Intake/Output Summary (Last 24 hours) at 10/21/12 4098 Last data filed at 10/21/12 0800  Gross per 24 hour  Intake    389 ml  Output      0 ml  Net    389 ml   Filed Weights   10/19/12 0407 10/20/12 0400 10/21/12 0500  Weight: 194 lb 10.7 oz (88.3 kg) 205 lb 7.5 oz (93.2 kg) 195 lb 1.7 oz (88.5 kg)    PHYSICAL EXAM  General: Pleasant, NAD. Neuro: Alert and oriented X 3. Moves all extremities spontaneously. Psych: Normal affect. HEENT:  Normal  Neck: Supple without bruits or JVD. Lungs:  Resp regular and unlabored, CTA. Heart: RRR no s3, s4, or murmurs. Abdomen: Soft, non-tender, non-distended, BS + x 4.  Extremities: No clubbing, cyanosis or edema. DP/PT/Radials 2+ and equal bilaterally.  R wrist cath site w/o bleeding/bruit/hematoma.  Accessory Clinical Findings  CBC  Recent Labs  10/18/12 1510 10/18/12 1722 10/19/12 0520  WBC  --  10.7* 6.5    NEUTROABS  --  8.5*  --   HGB 15.0 14.2 13.3  HCT 44.0 41.3 39.2  MCV  --  89.4 89.5  PLT  --  213 211   Basic Metabolic Panel  Recent Labs  10/18/12 1510 10/18/12 1722  10/20/12 0455 10/21/12 0430  NA 139 138  < > 138 139  K 3.2* 3.2*  < > 3.9 4.1  CL 98 99  < > 106 104  CO2  --  26  < > 25 26  GLUCOSE 113* 83  < > 104* 92  BUN 7 9  < > 7 8  CREATININE 0.90 0.69  < > 0.67 0.79  CALCIUM  --  9.2  < > 8.4 8.6  MG  --  1.9  --   --   --   PHOS  --  3.6  --   --   --   < > = values in this interval not displayed. Liver Function Tests  Recent Labs  10/18/12 1505 10/18/12 1722  AST 21 26  ALT 18 19  ALKPHOS 81 81  BILITOT 0.3 0.3  PROT 6.9 6.8  ALBUMIN 3.7 3.6  Cardiac Enzymes  Recent Labs  10/18/12 1505  10/19/12 1417 10/19/12 1831 10/20/12 0005  CKTOTAL 144  --   --   --   --   CKMB 3.8  --   --   --   --   TROPONINI 0.41*  < > 1.34* 0.87* 1.07*  < > = values in this interval not displayed.  Hemoglobin A1C  Recent Labs  10/19/12 0520  HGBA1C 5.9*   Fasting Lipid Panel  Recent Labs  10/19/12 0520  CHOL 173  HDL 51  LDLCALC 93  TRIG 144  CHOLHDL 3.4   Thyroid Function Tests  Recent Labs  10/18/12 1722  TSH 2.653   TELE  rsr  Radiology/Studies  Portable Chest 1 View  10/18/2012  *RADIOLOGY REPORT*  Clinical Data: Chest pain  PORTABLE CHEST - 1 VIEW  Comparison: None.  Findings: Normal heart size.  Coronary artery stent is noted. Negative for heart failure.  Lungs are clear without infiltrate or effusion.  IMPRESSION: No acute abnormality.   Original Report Authenticated By: Deanna Hogan, M.D.     ASSESSMENT AND PLAN  1.  Acute Ant STEMI/CAD:  S/p PCI/DES to the LAD with re-look cath on 2/18 revealing patency of stents and evidence of intramural hematoma.  She has had no further chest pain and has been ambulating w/o difficulty.  She remains on asa/plavix/bb/statin/oral nitrate.  With reduced EF and stable bp/creat, will resume  losartan (prev on 100/day, will start back with 25 given addition of bb.  She is intolerant to ACEI's 2/2 cough).  2.  ICM:  Euvolemic.  EF 35% by V gram.  F/U echo today.  Resume ARB as above.  Cont coreg.  3.  HTN:  Stable.  4.  HL:  LDL 93.  Cont high potency statin.  5.  Dispo:  Ambulate today. ? tx to tele later with eye toward d/c tomorrow.  Signed, Deanna Hogan  Patient seen, examined. Available data reviewed. Agree with findings, assessment, and plan as outlined by Deanna Givens, NP. Echo reviewed and shows normal LV function. This indicates an excellent prognosis. Long discussion with the patient and her mother about post-MI instructions/restrications. She has stabilized with no further chest pain. I have reviewed her angiograms with colleagues, and not sure if the initial mechanism of her MI was related to atherosclerotic plaque rupture or spontaneous hematoma/dissection. Regardless of mechanism, treatment is essentially the same and she understands this. Anticipate discharge home tomorrow. Meds reviewed and are appropriate.  Deanna Hogan, M.D. 10/21/2012 10:56 PM

## 2012-10-21 NOTE — Progress Notes (Signed)
Spoke with Christain Sacramento, NP about patients headache of 6/10; unrelieved by Vicodin. Also, notified of patients c/o of jaw pain since yesterday and history of migraines. New orders to be placed.  Dawson Bills, RN

## 2012-10-21 NOTE — Progress Notes (Signed)
  Echocardiogram 2D Echocardiogram has been performed.  Maurio Baize 10/21/2012, 10:13 AM

## 2012-10-21 NOTE — Progress Notes (Signed)
[id number] Cardiac Rehab Pt states that she walked 1 hour ago. Completed discharge education with pt and answered her questions. She voices understanding.

## 2012-10-21 NOTE — Progress Notes (Signed)
VASCULAR LAB PRELIMINARY  PRELIMINARY  PRELIMINARY  PRELIMINARY  Carotid duplex completed.    Preliminary report:  Right - Less than 40% with mild homogeneous plaque noted in the bulb area. Vertebral artery flow is antegrade. Left - No evdience of significant ICA stenosis. Vertebral artery flow is antegrade.  Zipporah Finamore, RVS 10/21/2012, 3:00 PM

## 2012-10-22 ENCOUNTER — Encounter (HOSPITAL_COMMUNITY): Payer: Self-pay | Admitting: Physician Assistant

## 2012-10-22 LAB — BASIC METABOLIC PANEL WITH GFR
BUN: 10 mg/dL (ref 6–23)
CO2: 30 meq/L (ref 19–32)
Calcium: 8.7 mg/dL (ref 8.4–10.5)
Chloride: 103 meq/L (ref 96–112)
Creatinine, Ser: 0.77 mg/dL (ref 0.50–1.10)
GFR calc Af Amer: >90 mL/min
GFR calc non Af Amer: >90 mL/min
Glucose, Bld: 92 mg/dL (ref 70–99)
Potassium: 3.9 meq/L (ref 3.5–5.1)
Sodium: 139 meq/L (ref 135–145)

## 2012-10-22 MED ORDER — LOSARTAN POTASSIUM 25 MG PO TABS: 25.0000 mg | ORAL_TABLET | Freq: Every day | ORAL | Status: DC

## 2012-10-22 MED ORDER — ASPIRIN 81 MG PO TABS: 81.0000 mg | ORAL_TABLET | Freq: Every day | ORAL | Status: AC

## 2012-10-22 MED ORDER — NITROGLYCERIN 0.4 MG SL SUBL: 0.4000 mg | SUBLINGUAL_TABLET | SUBLINGUAL | Status: DC | PRN

## 2012-10-22 MED ORDER — CARVEDILOL 6.25 MG PO TABS: 6.2500 mg | ORAL_TABLET | Freq: Two times a day (BID) | ORAL | Status: DC

## 2012-10-22 MED ORDER — CLOPIDOGREL BISULFATE 75 MG PO TABS: 75.0000 mg | ORAL_TABLET | Freq: Every day | ORAL | Status: DC

## 2012-10-22 MED ORDER — ATORVASTATIN CALCIUM 80 MG PO TABS: 80.0000 mg | ORAL_TABLET | Freq: Every day | ORAL | Status: DC

## 2012-10-22 NOTE — Progress Notes (Signed)
Reviewed discharge instructions with patient. She verbally understood and signed. Iv's removed and awaiting the arrival of her transportation. Will continue to monitor.  Kioni Stahl, Charlaine Dalton RN

## 2012-10-22 NOTE — Progress Notes (Signed)
[id number] Cardiac Rehab Pt in bed states that she wants to get some sleep, states that she will walk after getting a nap.She denies any questions related to education provided. Also states that she took several walks yesterday, denies any problems.

## 2012-10-22 NOTE — Discharge Summary (Signed)
Discharge Summary   Patient ID: Deanna Hogan MRN: [id number], DOB/AGE: 1961/05/19 52 y.o. Admit date: 10/18/2012 D/C date:     10/22/2012  Primary Cardiologist: Excell Seltzer  Primary Discharge Diagnoses:  1. CAD/acute anterior STEMI - s/p overlapping DES to prox-mid LAD 10/18/12 - relook cath 10/19/12 for recurrent chest pain showed intramural hematoma in mid-distal LAD, managed medically - unclear if mechanism of her initial MI was related to atherosclerotic plaque rupture or spontaneous hematoma/dissection, but treatment remains the same 2. Transient ischemic cardiomyopathy, improved - EF 35% by cath but improved to 55-60% by echo 10/21/12 3. HTN 4. Hyperlipidemia - statin increased, will need to follow up labs 5. Carotid artery disease - dopplers this adm: Right <40% ICA stenosis with mild heterogeneous plaque  Hospital Course: [name] is a 52 y/o F with history of HTN and HL who presented initially to the ER of [facility] 2/17 with complaints of chest pain that had started 1 hour prior. She was found to have ST elevation in V2 to V4 in ER. After treatment with ASA, nitroglycerin and bolus of heparin, she was transferred to [facility] for urgent cardiac cath. Upon arrival to cath lab, the patient was chest pain free with no SOB or palpitations. She underwent cath demonstrating severe LAD stenosis that was treated successfully with overlapping DES. SHe had nonobstructive disease of the RCA and Cx with wide patency of both vessels. There was severe segmental LV dysfunction with EF 35%. Peak troponin was 1.34. She was started on ASA, statin, BB, and Plavix. Later that night she developed recurrent chest pain and received morphine (which caused itching) and IV NTG with improvement. However, the following day on 2/18, she developed again recurrent symptoms just like the pain of her MI. EKG showed evolving changes of anterior MI without new ST elevation. Relook cath was performed  demonstrating patent stent in the prox-mid LAD, but a new long segment of narrowing in the mid-to-distal LAD that was consistent with intramural hematoma. Troponin showed a mild second rise to 1.07. Effient was switched to plavix.  Imdur was added but she did not tolerate this due to headache. Dr. Excell Seltzer reviewed angiograms with colleagues, and it was not certain if the initial mechanism of her MI was related to atherosclerotic plaque rupture or spontaneous hematoma/dissection. Regardless of mechanism, treatment is essentially the same. Followup 2D echo was obtained 10/21/12 showing normalization of EF to 55-60%, mild LVH, gr 2 d/d, normal RV function, PA pressure . Carotid dopplers were obtained showing right <40% ICA stenosis with mild heterogeneous plaque with antegrade vertebral flow, left - no evidence of ICA stenosis. Today, the patient is feeling well. She has been ambulating around the unit without difficulty. Dr. Excell Seltzer has seen and examined her today and feels she is stable for discharge. She does not currently work so basic activity precautions were provided.   Discharge Vitals: Blood pressure 117/75, pulse 76, temperature 97.5 F (36.4 C), temperature source Oral, resp. rate 20, height 5\' 5"  (1.651 m), weight 198 lb 3.1 oz (89.9 kg), SpO2 97.00%.  Labs: Lab Results  Component Value Date   WBC 6.5 10/19/2012   HGB 13.3 10/19/2012   HCT 39.2 10/19/2012   MCV 89.5 10/19/2012   PLT 211 10/19/2012    Recent Labs Lab 10/18/12 1722  10/22/12 0550  NA 138  < > 139  K 3.2*  < > 3.9  CL 99  < > 103  CO2 26  < > 30  BUN 9  < > 10  CREATININE 0.69  < > 0.77  CALCIUM 9.2  < > 8.7  PROT 6.8  --   --   BILITOT 0.3  --   --   ALKPHOS 81  --   --   ALT 19  --   --   AST 26  --   --   GLUCOSE 83  < > 92  < > = values in this interval not displayed.  Recent Labs  10/19/12 1417 10/19/12 1831 10/20/12 0005  TROPONINI 1.34* 0.87* 1.07*   Lab Results  Component Value Date   CHOL 173  10/19/2012   HDL 51 10/19/2012   LDLCALC 93 10/19/2012   TRIG 144 10/19/2012    Diagnostic Studies/Procedures   1. Cardiac catheterizations this admission, please see full report and above for summary.  2. Portable Chest 1 View2/17/2014  *RADIOLOGY REPORT*  Clinical Data: Chest pain  PORTABLE CHEST - 1 VIEW  Comparison: None.  Findings: Normal heart size.  Coronary artery stent is noted. Negative for heart failure.  Lungs are clear without infiltrate or effusion.  IMPRESSION: No acute abnormality.   Original Report Authenticated By: Janeece Riggers, M.D.     Discharge Medications     Medication List    STOP taking these medications       losartan-hydrochlorothiazide 100-25 MG per tablet  Commonly known as:  HYZAAR      TAKE these medications       aspirin 81 MG tablet  Take 1 tablet (81 mg total) by mouth daily.     atorvastatin 80 MG tablet  Commonly known as:  LIPITOR  Take 1 tablet (80 mg total) by mouth daily.     carvedilol 6.25 MG tablet  Commonly known as:  COREG  Take 1 tablet (6.25 mg total) by mouth 2 (two) times daily with a meal.     clopidogrel 75 MG tablet  Commonly known as:  PLAVIX  Take 1 tablet (75 mg total) by mouth daily with breakfast.     desvenlafaxine 100 MG 24 hr tablet  Commonly known as:  PRISTIQ  Take 100 mg by mouth daily.     losartan 25 MG tablet  Commonly known as:  COZAAR  Take 1 tablet (25 mg total) by mouth daily.     nitroGLYCERIN 0.4 MG SL tablet  Commonly known as:  NITROSTAT  Place 1 tablet (0.4 mg total) under the tongue every 5 (five) minutes as needed for chest pain (up to 3 doses).        Disposition   The patient will be discharged in stable condition to home.     Discharge Orders   Future Appointments Provider Department Dept Phone   10/29/2012 10:10 AM Beatrice Lecher, PA [facility] Main Office [place]) [phone]   Future Orders Complete By Expires     Amb Referral to Cardiac Rehabilitation  As directed       Diet - low sodium heart healthy  As directed     Increase activity slowly  As directed     Scheduling Instructions:      No driving for 2 weeks. No lifting over 10 lbs for 4 weeks. No sexual activity for 4 weeks. Keep procedure site clean & dry. If you notice increased pain, swelling, bleeding or pus, call/return!  You may shower, but no soaking baths/hot tubs/pools for 1 week.      Follow-up Information   Follow up with Tereso Newcomer,  PA. (10/29/12 at 10:10am)    Contact information:   [facility] [address] [place] [address] [phone]         Duration of Discharge Encounter: Greater than 30 minutes including physician and PA time.  Signed, Kriste Basque Dunn PA-C 10/22/2012, 10:07 AM

## 2012-10-22 NOTE — Progress Notes (Signed)
    Subjective:  No chest pain or dyspnea. Patient is tired but otherwise feels well this morning.  Objective:  Vital Signs in the last 24 hours: Temp:  [96.9 F (36.1 C)-98.4 F (36.9 C)] 97.5 F (36.4 C) (02/21 0800) Pulse Rate:  [76-78] 76 (02/21 0800) Resp:  [17-20] 20 (02/21 0800) BP: (101-128)/(57-76) 117/75 mmHg (02/21 0800) SpO2:  [96 %-98 %] 97 % (02/21 0800) Weight:  [89.9 kg (198 lb 3.1 oz)] 89.9 kg (198 lb 3.1 oz) (02/21 0543)  Intake/Output from previous day: 02/20 0701 - 02/21 0700 In: 240 [P.O.:240] Out: -   Physical Exam: Pt is alert and oriented, NAD HEENT: normal Neck: JVP - normal, carotids 2+= without bruits Lungs: CTA bilaterally CV: RRR without murmur or gallop Abd: soft, NT, Positive BS, no hepatomegaly Ext: no C/C/E, distal pulses intact and equal, right radial site tender but otherwise clear Skin: warm/dry no rash  Lab Results: No results found for this basename: WBC, HGB, PLT,  in the last 72 hours  Recent Labs  10/21/12 0430 10/22/12 0550  NA 139 139  K 4.1 3.9  CL 104 103  CO2 26 30  GLUCOSE 92 92  BUN 8 10  CREATININE 0.79 0.77    Recent Labs  10/19/12 1831 10/20/12 0005  TROPONINI 0.87* 1.07*    Cardiac Studies: 2-D echo: Study Conclusions  - Left ventricle: The cavity size was normal. Wall thickness was increased in a pattern of mild LVH. Systolic function was normal. The estimated ejection fraction was in the range of 55% to 60%. Wall motion was normal; there were no regional wall motion abnormalities. Features are consistent with a pseudonormal left ventricular filling pattern, with concomitant abnormal relaxation and increased filling pressure (grade 2 diastolic dysfunction). - Aortic valve: There was no stenosis. - Mitral valve: Trivial regurgitation. - Right ventricle: The cavity size was normal. Systolic function was normal. - Tricuspid valve: Peak RV-RA gradient: 26mm Hg (S). - Pulmonary arteries: PA peak  pressure: 31mm Hg (S). - Inferior vena cava: The vessel was normal in size; the respirophasic diameter changes were in the normal range (= 50%); findings are consistent with normal central venous pressure. Impressions:  - Normal LV size and systolic function, EF 55-60%. Mild LV hypertrophy. Moderate diastolic dysfunction. Normal RV size and systolic function. No significant valvular abnormalities.  Tele: Sinus rhythm, no significant arrhythmia.  Assessment/Plan:  1. Anterior STEMI, treated with primary PCI. Likely intramural hematoma diffusely in the distal vessel on relook cardiac catheterization. The patient has stabilized. I am very encouraged that her LV function has completely normalized. Will continue her current medical program and I think she is stable for discharge home today. Her medications were reviewed and she is on appropriate post MI medical therapy with aspirin, Plavix, a statin drug, a beta blocker, and an ARB.  2. Hypertension: Stable  3. Hyperlipidemia: Continue hypodensity statin  4. Disposition: Home today. Recommend followup in one to 2 weeks with Lorin Picket for [place] in the office and I will see her for long-term followup.   Tonny Bollman, M.D. 10/22/2012, 10:00 AM

## 2012-10-28 ENCOUNTER — Telehealth: Payer: Self-pay | Admitting: Cardiovascular Disease

## 2012-10-28 NOTE — Telephone Encounter (Signed)
Pt states has some swelling and tightness bellow the cath  site on right arm. Pt states has some discoloration bellow the cath entrance site also. Pt would like to know if it is fine to wait till tomorrow. Pt has an appointment with Scott weaver PA  10/29/12 at 10:10 Am. Pt is aware that she can come today so we can assess the site, if she thinks the swelling is worse from yesterday. Pt denies any increased swelling since yesterday nor pain on  site. . Pt states that it can be weight till tomorrow; when she has the OV.

## 2012-10-28 NOTE — Telephone Encounter (Signed)
Discoloration on wrist, some swelling, tightness, since procedure, is this normal? pls call

## 2012-10-29 ENCOUNTER — Ambulatory Visit (INDEPENDENT_AMBULATORY_CARE_PROVIDER_SITE_OTHER): Payer: No Typology Code available for payment source | Admitting: Physician Assistant

## 2012-10-29 VITALS — BP 120/82 | HR 72 | Ht 65.5 in | Wt 197.0 lb

## 2012-10-29 LAB — BASIC METABOLIC PANEL
Chloride: 103 mEq/L (ref 96–112)
Potassium: 4.3 mEq/L (ref 3.5–5.1)
Sodium: 137 mEq/L (ref 135–145)

## 2012-10-29 NOTE — Patient Instructions (Addendum)
Lab today, BMET  FASTING LIPID AND LIVER PANEL 12/10/12; LAB OPENS AT 7:30 AM  PLEASE FOLLOW UP WITH DR. Excell Seltzer ON 12/10/12 @ 8:30

## 2012-10-29 NOTE — Progress Notes (Signed)
[address] [place], [place]  [address] Phone: [phone], Fax:  [phone]  Date:  10/29/2012   ID:  Saiya, Crist May 26, 1961, MRN [id number]  PCP:  Dalbert Mayotte, MD  Primary Cardiologist:  Dr. Tonny Bollman     History of Present Illness: Deanna Hogan is a 52 y.o. female who returns for follow up after recent admission for anterior STEMI.  She has a hx of HTN, HL. She was admitted 2/17-2/21. Emergent LHC 10/18/12: LAD 90% before origin of D1, mLAD 80%, CFX patent, RCA patent, dPDA 70%, EF 35%, periapical AK. PCI: Promus Premier DES x2 to the mid LAD. Patient had recurrent pain and underwent relook catheterization. Stents in the proximal-mid LAD were patent but she had a long segment of narrowing in the mid to distal LAD (70-80%) consistent with intramural hematoma. Effient was changed to Plavix. Isosorbide was added but she could not tolerate due to HA. Films were reviewed. Initial mechanism of MI unclear (question plaque rupture versus spontaneous hematoma/dissection). Followup echo 10/21/12: Mild LVH, EF 55%, normal wall motion, grade 2 diastolic dysfunction, trivial MR, PASP 31.  Carotid Dopplers 2/14: Right less than 40% ICA.  Since d/c, she has had 2 episodes of CP occurring after hours of activity.  Notes assoc nausea, dyspnea.  No diaphoresis.  No syncope or near syncope.  No orthopnea, PND.  No edema.  She did take NTG with relief.  Overall feels better.  Eager to start cardiac rehab.    Labs (2/14):  K 3.9, creatinine 0.72, ALT 19, LDL 93, Hgb 13.3, TSH 2.653  Wt Readings from Last 3 Encounters:  10/29/12 197 lb (89.359 kg)  10/22/12 198 lb 3.1 oz (89.9 kg)  10/22/12 198 lb 3.1 oz (89.9 kg)     Past Medical History  Diagnosis Date  . HTN (hypertension)   . HLD (hyperlipidemia)   . CAD (coronary artery disease)     a. Ant STEMI 10/2012 s/p overlapped DES x 2 to prox-mid LAD, with relook cath showing intramural hematoma mid-distal LAD mg'd  medically (unclear if initial mechanism of MI was plaque rupture or dissection).   . Ischemic cardiomyopathy     a. Transient - EF 35% at time of STEMI, improved to 55-60% by echo days later.  . Carotid artery disease     a. Dopp 10/2012: R <40% ICA stenosis with mild heterogeneous plaque.    Current Outpatient Prescriptions  Medication Sig Dispense Refill  . aspirin 81 MG tablet Take 1 tablet (81 mg total) by mouth daily.      [name] atorvastatin (LIPITOR) 80 MG tablet Take 1 tablet (80 mg total) by mouth daily.  30 tablet  6  . carvedilol (COREG) 6.25 MG tablet Take 1 tablet (6.25 mg total) by mouth 2 (two) times daily with a meal.  60 tablet  6  . clopidogrel (PLAVIX) 75 MG tablet Take 1 tablet (75 mg total) by mouth daily with breakfast.  30 tablet  6  . desvenlafaxine (PRISTIQ) 100 MG 24 hr tablet Take 100 mg by mouth daily.      [name] losartan (COZAAR) 25 MG tablet Take 1 tablet (25 mg total) by mouth daily.  30 tablet  6  . nitroGLYCERIN (NITROSTAT) 0.4 MG SL tablet Place 1 tablet (0.4 mg total) under the tongue every 5 (five) minutes as needed for chest pain (up to 3 doses).  25 tablet  4   No current facility-administered medications for this visit.  Allergies:    Allergies  Allergen Reactions  . Lisinopril Cough  . Morphine And Related Itching    Itching subsided with benadryl    Social History:  The patient  reports that she has never smoked. She has never used smokeless tobacco. She reports that she does not drink alcohol or use illicit drugs.   ROS:  Please see the history of present illness.      All other systems reviewed and negative.   PHYSICAL EXAM: VS:  BP 120/82  Pulse 72  Ht 5' 5.5" (1.664 m)  Wt 197 lb (89.359 kg)  BMI 32.27 kg/m2 Well nourished, well developed, in no acute distress HEENT: normal Neck: no JVD Cardiac:  normal S1, S2; RRR; no murmur Lungs:  clear to auscultation bilaterally, no wheezing, rhonchi or rales Abd: soft, nontender, no  hepatomegaly Ext: no edema; right wrist without hematoma or bruit  Skin: warm and dry Neuro:  CNs 2-12 intact, no focal abnormalities noted  EKG:  NSR, HR 67, poor R wave progression, T-wave inversions in V2-V6, no change from prior tracing     ASSESSMENT AND PLAN:  1. Coronary Artery Disease:  She is doing well after recent anterior STEMI treated with primary PCI.  She has had some chest pain.  Likely residual pain from noted intramural hematoma in LAD on relook cath.  Recommend continue current Rx.  Discussed with Dr. Tonny Bollman who agreed.  Arrange cardiac rehab.  We discussed the importance of dual antiplatelet therapy.  2. Hyperlipidemia:  Check Lipids and LFTs in 6 weeks.   3. Hypertension:  Controlled.  Continue current therapy.  4. Carotid Stenosis:  Will need dopplers in 1 year. 5. Disposition:  Follow up with Dr. Tonny Bollman in 1 month.  Luna Glasgow, PA-C  11:09 AM 10/29/2012

## 2012-11-01 ENCOUNTER — Telehealth: Payer: Self-pay | Admitting: *Deleted

## 2012-11-01 NOTE — Telephone Encounter (Signed)
lmom labs good 

## 2012-11-01 NOTE — Telephone Encounter (Signed)
Message copied by Tarri Fuller on Mon Nov 01, 2012 10:21 AM ------      Message from: [place], [place] T      Created: Mon Nov 01, 2012  9:39 AM       Potassium and kidney function look good.      Continue with current treatment plan.      Tereso Newcomer, PA-C  4:49 PM 07/15/2012 ------

## 2012-11-04 ENCOUNTER — Ambulatory Visit (HOSPITAL_COMMUNITY): Payer: No Typology Code available for payment source

## 2012-11-05 ENCOUNTER — Telehealth: Payer: Self-pay | Admitting: Nurse Practitioner

## 2012-11-05 NOTE — Telephone Encounter (Signed)
Pt called in stating that earlier today, she had n/v and was unable to keep her morning medicines down.  She has not had any chest pressure (prior anginal equiv) or dyspnea.  N/v improved now.  I advised that she do her best to keep hydrated and re-take asa 81 and plavix 75, since she is sure that she was unable to keep them down this AM.  If Ss return/worsen, she may need to present to her local ED for evaluation/ivf and possibly anticoagulation if unable to keep plavix/asa down.  She verbalized understanding.

## 2012-11-08 ENCOUNTER — Encounter (HOSPITAL_COMMUNITY): Payer: No Typology Code available for payment source

## 2012-11-10 ENCOUNTER — Encounter (HOSPITAL_COMMUNITY): Payer: No Typology Code available for payment source

## 2012-11-11 ENCOUNTER — Encounter (HOSPITAL_COMMUNITY)
Admission: RE | Admit: 2012-11-11 | Discharge: 2012-11-11 | Disposition: A | Discharge status: Home / Self Care | Payer: No Typology Code available for payment source | Source: Ambulatory Visit | Attending: Cardiovascular Disease | Admitting: Cardiovascular Disease

## 2012-11-11 ENCOUNTER — Ambulatory Visit (HOSPITAL_COMMUNITY): Payer: No Typology Code available for payment source

## 2012-11-11 DIAGNOSIS — I10 Essential (primary) hypertension: Secondary | ICD-10-CM | POA: Insufficient documentation

## 2012-11-11 DIAGNOSIS — Z5189 Encounter for other specified aftercare: Secondary | ICD-10-CM | POA: Insufficient documentation

## 2012-11-11 DIAGNOSIS — E785 Hyperlipidemia, unspecified: Secondary | ICD-10-CM | POA: Insufficient documentation

## 2012-11-11 DIAGNOSIS — I2510 Atherosclerotic heart disease of native coronary artery without angina pectoris: Secondary | ICD-10-CM | POA: Insufficient documentation

## 2012-11-11 DIAGNOSIS — Z8249 Family history of ischemic heart disease and other diseases of the circulatory system: Secondary | ICD-10-CM | POA: Insufficient documentation

## 2012-11-11 DIAGNOSIS — I2119 ST elevation (STEMI) myocardial infarction involving other coronary artery of inferior wall: Secondary | ICD-10-CM | POA: Insufficient documentation

## 2012-11-11 NOTE — Progress Notes (Signed)
Cardiac Rehab Medication Review by a Pharmacist  Does the patient  feel that his/her medications are working for him/her?  Yes.   Has the patient been experiencing any side effects to the medications prescribed?  Yes. Patient states that she has been experiencing dizziness. I fold her that this could be related to her medications or potentially dehydration and would most likely be related to her BP meds. Advised patient to follow up with her doctor on this matter and to check BP at home. Patient also states she has been feeling some fatigue. I counseled her that this could very well be due to her new med (BBlocker-coreg) and that often this will improve with time.  Does the patient measure his/her own blood pressure or blood glucose at home?  No. Advised patient to check BP at home, especially in setting of dizziness.   Does the patient have any problems obtaining medications due to transportation or finances?   no  Understanding of regimen: good Understanding of indications: good Potential of compliance: excellent    Pharmacist comments: Patient has a great understanding of her medications and their uses.    Lillia Pauls, PharmD Clinical Pharmacist Pager: [phone] Phone: [phone] 11/11/2012 8:30 AM

## 2012-11-12 ENCOUNTER — Encounter (HOSPITAL_COMMUNITY): Payer: No Typology Code available for payment source

## 2012-11-15 ENCOUNTER — Encounter (HOSPITAL_COMMUNITY): Payer: No Typology Code available for payment source

## 2012-11-17 ENCOUNTER — Encounter (HOSPITAL_COMMUNITY): Payer: Self-pay

## 2012-11-17 ENCOUNTER — Encounter (HOSPITAL_COMMUNITY): Payer: No Typology Code available for payment source

## 2012-11-17 ENCOUNTER — Encounter (HOSPITAL_COMMUNITY)
Admission: RE | Admit: 2012-11-17 | Discharge: 2012-11-17 | Disposition: A | Discharge status: Home / Self Care | Payer: No Typology Code available for payment source | Source: Ambulatory Visit | Attending: Cardiology | Admitting: Cardiology

## 2012-11-17 NOTE — Progress Notes (Addendum)
Pt started cardiac rehab today.  Pt tolerated light exercise without difficulty.  VSS, telemetry-NSR, TWI.  Pt oriented to exercise equipment and routine.  Understanding verbalized.  PHQ9 score-0. Pt c/o dizziness worsening at home with fatigue.    Pt states she has reported this to MD.  Pt states the dizziness is worse with movement, occasionally while lying down, never upon standing.  Pt describes as feeling "room spinning".  Dr. Excell Seltzer made aware. Will continue to monitor.

## 2012-11-19 ENCOUNTER — Encounter (HOSPITAL_COMMUNITY): Payer: No Typology Code available for payment source

## 2012-11-22 ENCOUNTER — Encounter (HOSPITAL_COMMUNITY)
Admission: RE | Admit: 2012-11-22 | Discharge: 2012-11-22 | Disposition: A | Discharge status: Home / Self Care | Payer: No Typology Code available for payment source | Source: Ambulatory Visit | Attending: Cardiology | Admitting: Cardiology

## 2012-11-22 ENCOUNTER — Encounter (HOSPITAL_COMMUNITY): Admission: RE | Admit: 2012-11-22 | Payer: No Typology Code available for payment source | Source: Ambulatory Visit

## 2012-11-22 ENCOUNTER — Encounter (HOSPITAL_COMMUNITY): Payer: No Typology Code available for payment source

## 2012-11-23 ENCOUNTER — Telehealth (HOSPITAL_COMMUNITY): Payer: Self-pay | Admitting: Cardiac Rehabilitation

## 2012-11-23 NOTE — Telephone Encounter (Signed)
Message copied by Robyne Peers on Tue Nov 23, 2012  9:41 AM ------      Message from: Tonny Bollman      Created: Sat Nov 20, 2012  5:40 AM      Regarding: RE: Cardiac Rehab       I looked at her meds and don't see anything obvious. Could you send me her flowsheet and I'll look at BP and HR's. If those are ok, I don't really have any other suggestions. She could try Meclizine prn but I doubt she will want to medicate for this.            thx Kathlene November      ----- Message -----         From: Robyne Peers, RN         Sent: 11/17/2012   1:47 PM           To: Tonny Bollman, MD      Subject: Cardiac Rehab                                            Dear Dr. Excell Seltzer,            Pt started cardiac rehab today.  She did great!  However she reports at times she has dizziness associated with movement, not with standing and at times lying still.  Pt describes as "room spinning". She denies known vertigo.  Pt exercised today without difficulty, VSS. Pt is concerned could be r/t her medication.  I advised we will continue to monitor and she should inform us or your office of worsening/unimproved symptoms.  Her next f/u with you is scheduled for 12/10/12.  Do you have further recommendations?\            Thank you,      Deveron Furlong, RN      Cardiac Pulmonary Rehab              ------

## 2012-11-24 ENCOUNTER — Encounter (HOSPITAL_COMMUNITY): Payer: No Typology Code available for payment source

## 2012-11-26 ENCOUNTER — Encounter (HOSPITAL_COMMUNITY): Payer: No Typology Code available for payment source

## 2012-11-26 ENCOUNTER — Encounter (HOSPITAL_COMMUNITY)
Admission: RE | Admit: 2012-11-26 | Discharge: 2012-11-26 | Disposition: A | Discharge status: Home / Self Care | Payer: No Typology Code available for payment source | Source: Ambulatory Visit | Attending: Cardiology | Admitting: Cardiology

## 2012-11-29 ENCOUNTER — Encounter (HOSPITAL_COMMUNITY)
Admission: RE | Admit: 2012-11-29 | Discharge: 2012-11-29 | Disposition: A | Discharge status: Home / Self Care | Payer: No Typology Code available for payment source | Source: Ambulatory Visit | Attending: Cardiology | Admitting: Cardiology

## 2012-11-29 ENCOUNTER — Encounter (HOSPITAL_COMMUNITY): Payer: No Typology Code available for payment source

## 2012-12-01 ENCOUNTER — Encounter (HOSPITAL_COMMUNITY): Payer: No Typology Code available for payment source

## 2012-12-01 ENCOUNTER — Encounter (HOSPITAL_COMMUNITY)
Admission: RE | Admit: 2012-12-01 | Discharge: 2012-12-01 | Disposition: A | Discharge status: Home / Self Care | Payer: No Typology Code available for payment source | Source: Ambulatory Visit | Attending: Cardiology | Admitting: Cardiology

## 2012-12-01 DIAGNOSIS — E785 Hyperlipidemia, unspecified: Secondary | ICD-10-CM | POA: Insufficient documentation

## 2012-12-01 DIAGNOSIS — I2119 ST elevation (STEMI) myocardial infarction involving other coronary artery of inferior wall: Secondary | ICD-10-CM | POA: Insufficient documentation

## 2012-12-01 DIAGNOSIS — Z8249 Family history of ischemic heart disease and other diseases of the circulatory system: Secondary | ICD-10-CM | POA: Insufficient documentation

## 2012-12-01 DIAGNOSIS — I2510 Atherosclerotic heart disease of native coronary artery without angina pectoris: Secondary | ICD-10-CM | POA: Insufficient documentation

## 2012-12-01 DIAGNOSIS — I10 Essential (primary) hypertension: Secondary | ICD-10-CM | POA: Insufficient documentation

## 2012-12-01 DIAGNOSIS — Z5189 Encounter for other specified aftercare: Secondary | ICD-10-CM | POA: Insufficient documentation

## 2012-12-01 NOTE — Progress Notes (Signed)
Deanna Hogan 52 y.o. female Nutrition Note Spoke with pt.  Nutrition Plan and Nutrition Survey goals reviewed with pt. Pt is following Step 2 of the Therapeutic Lifestyle Changes diet. Per nutrition screen, pt wants to lose wt. Per discussion with pt, pt would like to lose wt but is "focusing on healthier eating habits."According to pt's last A1c pt is pre-diabetic. Pre-diabetes discussed. Pt expressed understanding of the information reviewed. Pt aware of nutrition education classes offered and is unable to attend nutrition classes.  Nutrition Diagnosis   Food-and nutrition-related knowledge deficit related to lack of exposure to information as related to diagnosis of: ? CVD ? Pre-DM (A1c 5.9)    Obesity related to excessive energy intake as evidenced by a BMI of 32.7  Nutrition RX/ Estimated Daily Nutrition Needs for: wt loss  1300-1800 Kcal, 35-50 gm fat, 8-14 gm sat fat, 1.2-1.8 gm trans-fat, <1500 mg sodium   Nutrition Intervention   Pt's individual nutrition plan reviewed with pt.   Benefits of adopting Therapeutic Lifestyle Changes discussed when Medficts reviewed.   Pt to attend the Portion Distortion class   Pt given handouts for: ? Nutrition I class ? Nutrition II class   Continue client-centered nutrition education by RD, as part of interdisciplinary care. Goal(s)   Pt to identify food quantities necessary to achieve: ? wt loss to a goal wt of 173-191 lb (78.8-87.0 kg) at graduation from cardiac rehab.    Pt to describe the benefit of including fruits, vegetables, whole grains, and low-fat dairy products in a heart healthy meal plan. Monitor and Evaluate progress toward nutrition goal with team. Nutrition Risk: Change to Moderate   Mickle Plumb, M.Ed, RD, LDN, CDE 12/01/2012 11:27 AM

## 2012-12-03 ENCOUNTER — Encounter (HOSPITAL_COMMUNITY): Payer: No Typology Code available for payment source

## 2012-12-06 ENCOUNTER — Encounter (HOSPITAL_COMMUNITY): Payer: No Typology Code available for payment source

## 2012-12-08 ENCOUNTER — Encounter (HOSPITAL_COMMUNITY): Payer: No Typology Code available for payment source

## 2012-12-10 ENCOUNTER — Encounter (HOSPITAL_COMMUNITY)
Admission: RE | Admit: 2012-12-10 | Discharge: 2012-12-10 | Disposition: A | Discharge status: Home / Self Care | Payer: No Typology Code available for payment source | Source: Ambulatory Visit | Attending: Cardiology | Admitting: Cardiology

## 2012-12-10 ENCOUNTER — Encounter (HOSPITAL_COMMUNITY): Payer: No Typology Code available for payment source

## 2012-12-10 ENCOUNTER — Ambulatory Visit (INDEPENDENT_AMBULATORY_CARE_PROVIDER_SITE_OTHER): Payer: No Typology Code available for payment source | Admitting: Cardiovascular Disease

## 2012-12-10 ENCOUNTER — Encounter: Payer: Self-pay | Admitting: Cardiovascular Disease

## 2012-12-10 VITALS — BP 120/84 | HR 60 | Ht 65.5 in | Wt 197.8 lb

## 2012-12-10 DIAGNOSIS — I2589 Other forms of chronic ischemic heart disease: Secondary | ICD-10-CM

## 2012-12-10 DIAGNOSIS — E785 Hyperlipidemia, unspecified: Secondary | ICD-10-CM

## 2012-12-10 DIAGNOSIS — I255 Ischemic cardiomyopathy: Secondary | ICD-10-CM

## 2012-12-10 DIAGNOSIS — I2510 Atherosclerotic heart disease of native coronary artery without angina pectoris: Secondary | ICD-10-CM

## 2012-12-10 NOTE — Progress Notes (Signed)
HPI:  52 year-old woman presenting for follow-up of CAD after anterior MI. Treated with PCI of the LAD (overlapping DES). Relook cath done for recurrent pain and ST changes showed distal LAD diffuse narrowing suggestive of intramural hematoma. Has done well with medical therapy and returns today for follow-up.  Overall she is doing well. No CP or dyspnea. She has occasional lightheadedness, nonpositional. Mild leg swelling at times. No other complaints. She is going to attend a [facility] in [place] in the near future.  Outpatient Encounter Prescriptions as of 12/10/2012  Medication Sig Dispense Refill  . aspirin 81 MG tablet Take 1 tablet (81 mg total) by mouth daily.      [name] atorvastatin (LIPITOR) 80 MG tablet Take 1 tablet (80 mg total) by mouth daily.  30 tablet  6  . carvedilol (COREG) 6.25 MG tablet Take 1 tablet (6.25 mg total) by mouth 2 (two) times daily with a meal.  60 tablet  6  . clopidogrel (PLAVIX) 75 MG tablet Take 1 tablet (75 mg total) by mouth daily with breakfast.  30 tablet  6  . desvenlafaxine (PRISTIQ) 100 MG 24 hr tablet Take 100 mg by mouth daily.      . fish oil-omega-3 fatty acids 1000 MG capsule Take 1 g by mouth daily.      [name] losartan (COZAAR) 25 MG tablet Take 1 tablet (25 mg total) by mouth daily.  30 tablet  6  . nitroGLYCERIN (NITROSTAT) 0.4 MG SL tablet Place 1 tablet (0.4 mg total) under the tongue every 5 (five) minutes as needed for chest pain (up to 3 doses).  25 tablet  4   No facility-administered encounter medications on file as of 12/10/2012.    Allergies  Allergen Reactions  . Lisinopril Cough  . Morphine And Related Itching    Itching subsided with benadryl  . Penicillins Rash    Past Medical History  Diagnosis Date  . HTN (hypertension)   . HLD (hyperlipidemia)   . CAD (coronary artery disease)     a. Ant STEMI 10/2012 s/p overlapped DES x 2 to prox-mid LAD, with relook cath showing intramural hematoma mid-distal LAD mg'd  medically (unclear if initial mechanism of MI was plaque rupture or dissection).   . Ischemic cardiomyopathy     a. Transient - EF 35% at time of STEMI, improved to 55-60% by echo days later.  . Carotid artery disease     a. Dopp 10/2012: R <40% ICA stenosis with mild heterogeneous plaque.    ROS: Negative except as per HPI  BP 120/84  Pulse 60  Ht 5' 5.5" (1.664 m)  Wt 89.721 kg (197 lb 12.8 oz)  BMI 32.4 kg/m2  PHYSICAL EXAM: Pt is alert and oriented, NAD HEENT: normal Neck: JVP - normal, carotids 2+= without bruits Lungs: CTA bilaterally CV: RRR without murmur or gallop Abd: soft, NT, Positive BS, no hepatomegaly Ext: no C/C/E, distal pulses intact and equal Skin: warm/dry no rash  ASSESSMENT AND PLAN: 1. CAD s/p anterior MI. Progressing well with Phase 2 cardiac rehab. Meds reviewed and appropriate. She is having some fatigue, likely med related. This is tolerable so will continue for now. Recheck in 2 months.  2. Hyperlipidemia - on high-dose atorvastatin. Repeat lipids.  3. LV dysfunction, related to #1. Follow-up echo. On ARB and coreg. No CHF symptoms at present.   Will follow-up in 2 months. Check echo and lipids/LFT's.  Tonny Bollman 12/10/2012 9:25 AM

## 2012-12-10 NOTE — Patient Instructions (Addendum)
Your physician recommends that you return for a FASTING LIPID and LIVER profile--nothing to eat or drink after midnight, lab opens at 7:30  Your physician has requested that you have an echocardiogram. Echocardiography is a painless test that uses sound waves to create images of your heart. It provides your doctor with information about the size and shape of your heart and how well your heart's chambers and valves are working. This procedure takes approximately one hour. There are no restrictions for this procedure.  Your physician recommends that you schedule a follow-up appointment in: 2 MONTHS with Dr Excell Seltzer  Your physician recommends that you continue on your current medications as directed. Please refer to the Current Medication list given to you today.

## 2012-12-13 ENCOUNTER — Encounter (HOSPITAL_COMMUNITY): Payer: No Typology Code available for payment source

## 2012-12-13 ENCOUNTER — Encounter (HOSPITAL_COMMUNITY)
Admission: RE | Admit: 2012-12-13 | Discharge: 2012-12-13 | Disposition: A | Discharge status: Home / Self Care | Payer: No Typology Code available for payment source | Source: Ambulatory Visit | Attending: Cardiology | Admitting: Cardiology

## 2012-12-13 NOTE — Progress Notes (Signed)
Home exercise guidelines reviewed with patient by Academic Intern, Atha Starks including endpoints, temperature precautions, target heart rate and rate of perceived exertions. [facility] mode of [facility] exercise. Pt voices understanding of instructions given.  Cristy Hilts, MS, ACSM CES

## 2012-12-15 ENCOUNTER — Encounter (HOSPITAL_COMMUNITY)
Admission: RE | Admit: 2012-12-15 | Discharge: 2012-12-15 | Disposition: A | Discharge status: Home / Self Care | Payer: No Typology Code available for payment source | Source: Ambulatory Visit | Attending: Cardiology | Admitting: Cardiology

## 2012-12-15 ENCOUNTER — Encounter (HOSPITAL_COMMUNITY): Payer: No Typology Code available for payment source

## 2012-12-17 ENCOUNTER — Encounter (HOSPITAL_COMMUNITY): Payer: No Typology Code available for payment source

## 2012-12-20 ENCOUNTER — Encounter (HOSPITAL_COMMUNITY): Payer: No Typology Code available for payment source

## 2012-12-22 ENCOUNTER — Encounter (HOSPITAL_COMMUNITY): Payer: No Typology Code available for payment source

## 2012-12-24 ENCOUNTER — Encounter (HOSPITAL_COMMUNITY): Payer: No Typology Code available for payment source

## 2012-12-24 ENCOUNTER — Ambulatory Visit (HOSPITAL_COMMUNITY): Payer: No Typology Code available for payment source | Attending: Cardiology | Admitting: Radiology

## 2012-12-24 ENCOUNTER — Other Ambulatory Visit (INDEPENDENT_AMBULATORY_CARE_PROVIDER_SITE_OTHER): Payer: No Typology Code available for payment source

## 2012-12-24 DIAGNOSIS — I10 Essential (primary) hypertension: Secondary | ICD-10-CM | POA: Insufficient documentation

## 2012-12-24 DIAGNOSIS — I255 Ischemic cardiomyopathy: Secondary | ICD-10-CM

## 2012-12-24 DIAGNOSIS — E785 Hyperlipidemia, unspecified: Secondary | ICD-10-CM

## 2012-12-24 DIAGNOSIS — I2510 Atherosclerotic heart disease of native coronary artery without angina pectoris: Secondary | ICD-10-CM | POA: Insufficient documentation

## 2012-12-24 DIAGNOSIS — I2589 Other forms of chronic ischemic heart disease: Secondary | ICD-10-CM | POA: Insufficient documentation

## 2012-12-24 DIAGNOSIS — I252 Old myocardial infarction: Secondary | ICD-10-CM | POA: Insufficient documentation

## 2012-12-24 LAB — HEPATIC FUNCTION PANEL
ALT: 22 U/L (ref 0–35)
AST: 20 U/L (ref 0–37)
Alkaline Phosphatase: 76 U/L (ref 39–117)
Bilirubin, Direct: 0 mg/dL (ref 0.0–0.3)
Total Bilirubin: 0.2 mg/dL — ABNORMAL LOW (ref 0.3–1.2)

## 2012-12-24 LAB — LIPID PANEL
LDL Cholesterol: 77 mg/dL (ref 0–99)
Total CHOL/HDL Ratio: 4
Triglycerides: 118 mg/dL (ref 0.0–149.0)

## 2012-12-24 NOTE — Progress Notes (Signed)
Echocardiogram performed.  

## 2012-12-27 ENCOUNTER — Encounter (HOSPITAL_COMMUNITY)
Admission: RE | Admit: 2012-12-27 | Discharge: 2012-12-27 | Disposition: A | Discharge status: Home / Self Care | Payer: No Typology Code available for payment source | Source: Ambulatory Visit | Attending: Cardiology | Admitting: Cardiology

## 2012-12-27 ENCOUNTER — Encounter (HOSPITAL_COMMUNITY): Payer: No Typology Code available for payment source

## 2012-12-29 ENCOUNTER — Encounter (HOSPITAL_COMMUNITY): Payer: No Typology Code available for payment source

## 2012-12-31 ENCOUNTER — Encounter (HOSPITAL_COMMUNITY)
Admission: RE | Admit: 2012-12-31 | Discharge: 2012-12-31 | Disposition: A | Discharge status: Home / Self Care | Payer: No Typology Code available for payment source | Source: Ambulatory Visit | Attending: Cardiology | Admitting: Cardiology

## 2012-12-31 ENCOUNTER — Encounter (HOSPITAL_COMMUNITY): Payer: No Typology Code available for payment source

## 2012-12-31 DIAGNOSIS — I2510 Atherosclerotic heart disease of native coronary artery without angina pectoris: Secondary | ICD-10-CM | POA: Insufficient documentation

## 2012-12-31 DIAGNOSIS — Z5189 Encounter for other specified aftercare: Secondary | ICD-10-CM | POA: Insufficient documentation

## 2012-12-31 DIAGNOSIS — I2119 ST elevation (STEMI) myocardial infarction involving other coronary artery of inferior wall: Secondary | ICD-10-CM | POA: Insufficient documentation

## 2012-12-31 DIAGNOSIS — Z8249 Family history of ischemic heart disease and other diseases of the circulatory system: Secondary | ICD-10-CM | POA: Insufficient documentation

## 2012-12-31 DIAGNOSIS — E785 Hyperlipidemia, unspecified: Secondary | ICD-10-CM | POA: Insufficient documentation

## 2012-12-31 DIAGNOSIS — I10 Essential (primary) hypertension: Secondary | ICD-10-CM | POA: Insufficient documentation

## 2013-01-03 ENCOUNTER — Encounter (HOSPITAL_COMMUNITY): Payer: No Typology Code available for payment source

## 2013-01-05 ENCOUNTER — Encounter (HOSPITAL_COMMUNITY): Payer: No Typology Code available for payment source

## 2013-01-07 ENCOUNTER — Encounter (HOSPITAL_COMMUNITY): Payer: No Typology Code available for payment source

## 2013-01-08 ENCOUNTER — Telehealth: Payer: Self-pay | Admitting: Physician Assistant

## 2013-01-08 NOTE — Telephone Encounter (Signed)
Patient called with c/o new onset n/v, since late last night. No CP, but unable to keep meds down. No diarrhea. I recommended Phenergan 25 mg prn, which I called in to [facility] (# [phone])

## 2013-01-10 ENCOUNTER — Encounter (HOSPITAL_COMMUNITY): Payer: No Typology Code available for payment source

## 2013-01-12 ENCOUNTER — Encounter (HOSPITAL_COMMUNITY): Payer: No Typology Code available for payment source

## 2013-01-14 ENCOUNTER — Encounter (HOSPITAL_COMMUNITY): Payer: No Typology Code available for payment source

## 2013-01-14 ENCOUNTER — Encounter (HOSPITAL_COMMUNITY)
Admission: RE | Admit: 2013-01-14 | Payer: No Typology Code available for payment source | Source: Ambulatory Visit | Attending: Cardiology | Admitting: Cardiology

## 2013-01-17 ENCOUNTER — Encounter (HOSPITAL_COMMUNITY): Payer: No Typology Code available for payment source

## 2013-01-19 ENCOUNTER — Encounter (HOSPITAL_COMMUNITY): Payer: No Typology Code available for payment source

## 2013-01-20 ENCOUNTER — Telehealth (HOSPITAL_COMMUNITY): Payer: Self-pay | Admitting: *Deleted

## 2013-01-21 ENCOUNTER — Encounter (HOSPITAL_COMMUNITY): Payer: No Typology Code available for payment source

## 2013-01-21 ENCOUNTER — Encounter (HOSPITAL_COMMUNITY)
Admission: RE | Admit: 2013-01-21 | Discharge: 2013-01-21 | Disposition: A | Discharge status: Home / Self Care | Payer: No Typology Code available for payment source | Source: Ambulatory Visit | Attending: Cardiology | Admitting: Cardiology

## 2013-01-24 ENCOUNTER — Encounter (HOSPITAL_COMMUNITY): Payer: No Typology Code available for payment source

## 2013-01-26 ENCOUNTER — Encounter (HOSPITAL_COMMUNITY): Payer: No Typology Code available for payment source

## 2013-01-28 ENCOUNTER — Encounter (HOSPITAL_COMMUNITY)
Admission: RE | Admit: 2013-01-28 | Discharge: 2013-01-28 | Disposition: A | Discharge status: Home / Self Care | Payer: No Typology Code available for payment source | Source: Ambulatory Visit | Attending: Cardiology | Admitting: Cardiology

## 2013-01-28 ENCOUNTER — Encounter (HOSPITAL_COMMUNITY): Payer: No Typology Code available for payment source

## 2013-01-31 ENCOUNTER — Encounter (HOSPITAL_COMMUNITY): Payer: No Typology Code available for payment source

## 2013-02-02 ENCOUNTER — Encounter (HOSPITAL_COMMUNITY): Payer: No Typology Code available for payment source

## 2013-02-04 ENCOUNTER — Encounter (HOSPITAL_COMMUNITY)
Admission: RE | Admit: 2013-02-04 | Discharge: 2013-02-04 | Disposition: A | Discharge status: Home / Self Care | Payer: No Typology Code available for payment source | Source: Ambulatory Visit | Attending: Cardiology | Admitting: Cardiology

## 2013-02-04 ENCOUNTER — Encounter (HOSPITAL_COMMUNITY): Payer: No Typology Code available for payment source

## 2013-02-04 DIAGNOSIS — Z8249 Family history of ischemic heart disease and other diseases of the circulatory system: Secondary | ICD-10-CM | POA: Insufficient documentation

## 2013-02-04 DIAGNOSIS — I10 Essential (primary) hypertension: Secondary | ICD-10-CM | POA: Insufficient documentation

## 2013-02-04 DIAGNOSIS — Z5189 Encounter for other specified aftercare: Secondary | ICD-10-CM | POA: Insufficient documentation

## 2013-02-04 DIAGNOSIS — I2510 Atherosclerotic heart disease of native coronary artery without angina pectoris: Secondary | ICD-10-CM | POA: Insufficient documentation

## 2013-02-04 DIAGNOSIS — E785 Hyperlipidemia, unspecified: Secondary | ICD-10-CM | POA: Insufficient documentation

## 2013-02-04 DIAGNOSIS — I2119 ST elevation (STEMI) myocardial infarction involving other coronary artery of inferior wall: Secondary | ICD-10-CM | POA: Insufficient documentation

## 2013-02-07 ENCOUNTER — Encounter (HOSPITAL_COMMUNITY): Payer: No Typology Code available for payment source

## 2013-02-09 ENCOUNTER — Encounter (HOSPITAL_COMMUNITY): Payer: No Typology Code available for payment source

## 2013-02-09 ENCOUNTER — Ambulatory Visit: Payer: No Typology Code available for payment source | Admitting: Cardiovascular Disease

## 2013-02-11 ENCOUNTER — Encounter (HOSPITAL_COMMUNITY): Payer: No Typology Code available for payment source

## 2013-02-14 ENCOUNTER — Ambulatory Visit (HOSPITAL_COMMUNITY): Payer: No Typology Code available for payment source

## 2013-02-14 ENCOUNTER — Encounter (HOSPITAL_COMMUNITY): Payer: No Typology Code available for payment source

## 2013-02-16 ENCOUNTER — Encounter (HOSPITAL_COMMUNITY): Payer: No Typology Code available for payment source

## 2013-02-18 ENCOUNTER — Encounter (HOSPITAL_COMMUNITY): Payer: No Typology Code available for payment source

## 2013-02-21 ENCOUNTER — Ambulatory Visit (HOSPITAL_COMMUNITY): Payer: No Typology Code available for payment source

## 2013-02-21 ENCOUNTER — Encounter (HOSPITAL_COMMUNITY): Payer: No Typology Code available for payment source

## 2013-02-23 ENCOUNTER — Encounter (HOSPITAL_COMMUNITY): Payer: No Typology Code available for payment source

## 2013-02-25 ENCOUNTER — Encounter (HOSPITAL_COMMUNITY): Payer: No Typology Code available for payment source

## 2013-02-28 ENCOUNTER — Ambulatory Visit (HOSPITAL_COMMUNITY): Payer: No Typology Code available for payment source

## 2013-02-28 ENCOUNTER — Encounter (HOSPITAL_COMMUNITY): Payer: No Typology Code available for payment source

## 2013-03-02 ENCOUNTER — Encounter (HOSPITAL_COMMUNITY): Payer: No Typology Code available for payment source

## 2013-03-07 ENCOUNTER — Encounter (HOSPITAL_COMMUNITY): Payer: No Typology Code available for payment source

## 2013-03-09 ENCOUNTER — Encounter (HOSPITAL_COMMUNITY): Payer: No Typology Code available for payment source

## 2013-03-11 ENCOUNTER — Encounter (HOSPITAL_COMMUNITY): Payer: No Typology Code available for payment source

## 2013-03-14 ENCOUNTER — Encounter (HOSPITAL_COMMUNITY): Payer: No Typology Code available for payment source

## 2013-03-16 ENCOUNTER — Encounter (HOSPITAL_COMMUNITY): Payer: No Typology Code available for payment source

## 2013-03-18 ENCOUNTER — Encounter (HOSPITAL_COMMUNITY): Payer: No Typology Code available for payment source

## 2013-03-21 ENCOUNTER — Encounter (HOSPITAL_COMMUNITY): Payer: No Typology Code available for payment source

## 2013-03-23 ENCOUNTER — Encounter (HOSPITAL_COMMUNITY): Payer: No Typology Code available for payment source

## 2013-03-25 ENCOUNTER — Encounter (HOSPITAL_COMMUNITY): Payer: No Typology Code available for payment source

## 2013-04-02 ENCOUNTER — Encounter (HOSPITAL_COMMUNITY): Payer: Self-pay | Admitting: *Deleted

## 2013-04-02 ENCOUNTER — Observation Stay (HOSPITAL_COMMUNITY)
Admission: EM | Admit: 2013-04-02 | Discharge: 2013-04-04 | Disposition: A | Discharge status: Home / Self Care | Payer: No Typology Code available for payment source | Attending: Family Medicine | Admitting: Family Medicine

## 2013-04-02 ENCOUNTER — Emergency Department (HOSPITAL_COMMUNITY): Payer: No Typology Code available for payment source

## 2013-04-02 DIAGNOSIS — R0602 Shortness of breath: Secondary | ICD-10-CM | POA: Insufficient documentation

## 2013-04-02 DIAGNOSIS — E039 Hypothyroidism, unspecified: Secondary | ICD-10-CM | POA: Insufficient documentation

## 2013-04-02 DIAGNOSIS — I10 Essential (primary) hypertension: Secondary | ICD-10-CM | POA: Insufficient documentation

## 2013-04-02 DIAGNOSIS — R110 Nausea: Secondary | ICD-10-CM | POA: Insufficient documentation

## 2013-04-02 DIAGNOSIS — I2510 Atherosclerotic heart disease of native coronary artery without angina pectoris: Secondary | ICD-10-CM

## 2013-04-02 DIAGNOSIS — R079 Chest pain, unspecified: Principal | ICD-10-CM | POA: Insufficient documentation

## 2013-04-02 DIAGNOSIS — I252 Old myocardial infarction: Secondary | ICD-10-CM | POA: Insufficient documentation

## 2013-04-02 DIAGNOSIS — M549 Dorsalgia, unspecified: Secondary | ICD-10-CM | POA: Insufficient documentation

## 2013-04-02 DIAGNOSIS — E785 Hyperlipidemia, unspecified: Secondary | ICD-10-CM

## 2013-04-02 LAB — POCT I-STAT TROPONIN I

## 2013-04-02 LAB — BASIC METABOLIC PANEL
GFR calc non Af Amer: 81 mL/min — ABNORMAL LOW (ref >90)
Glucose, Bld: 81 mg/dL (ref 70–99)
Potassium: 3.8 mEq/L (ref 3.5–5.1)
Sodium: 136 mEq/L (ref 135–145)

## 2013-04-02 LAB — CBC
Hemoglobin: 14.4 g/dL (ref 12.0–15.0)
MCHC: 34.7 g/dL (ref 30.0–36.0)
RBC: 4.6 MIL/uL (ref 3.87–5.11)

## 2013-04-02 LAB — TROPONIN I: Troponin I: <0.3 ng/mL (ref <0.30)

## 2013-04-02 MED ORDER — MORPHINE SULFATE 4 MG/ML IJ SOLN
4.0000 mg | Freq: Once | INTRAMUSCULAR | Status: AC
  Administered 2013-04-02: 4 mg via INTRAVENOUS
  Filled 2013-04-02: qty 1

## 2013-04-02 MED ORDER — ASPIRIN 81 MG PO CHEW
324.0000 mg | CHEWABLE_TABLET | Freq: Once | ORAL | Status: AC
  Administered 2013-04-02: 243 mg via ORAL
  Filled 2013-04-02: qty 4

## 2013-04-02 MED ORDER — ONDANSETRON HCL 4 MG/2ML IJ SOLN
4.0000 mg | Freq: Once | INTRAMUSCULAR | Status: AC
  Administered 2013-04-02: 4 mg via INTRAVENOUS
  Filled 2013-04-02: qty 2

## 2013-04-02 MED ORDER — DIPHENHYDRAMINE HCL 50 MG/ML IJ SOLN
25.0000 mg | Freq: Once | INTRAMUSCULAR | Status: AC
  Administered 2013-04-02: 25 mg via INTRAVENOUS
  Filled 2013-04-02: qty 1

## 2013-04-02 NOTE — ED Notes (Addendum)
Pt reports hx of MI and had similar symptoms then. Having mid chest pressure and pain into her back x 3 days, fatigue and headache also. ekg done at triage. Pt took two nitro pta with no relief.

## 2013-04-02 NOTE — ED Provider Notes (Signed)
CSN: [id number]     Arrival date & time 04/02/13  1839 History     First MD Initiated Contact with Patient 04/02/13 1912     Chief Complaint  Patient presents with  . Chest Pain  . Back Pain   (Consider location/radiation/quality/duration/timing/severity/associated sxs/prior Treatment) HPI Comments: Patient with h/o anterior MI 10/2012 with PCI of the LAD (overlapping DES). Relook cath done the following day for recurrent pain and ST changes showed distal LAD diffuse narrowing suggestive of intramural hematoma. Patient began having "pinching" pain lasting for approximately 30 seconds at a time, approx 4 times per hour. It did not radiate. She did have subjective SOB and nausea with the pain. Yesterday the patient developed middle back pain which she had with her previous MI. This pain is more constant. Patient took 2 nitroglycerin tonight which did not help. She has not had a full dose of aspirin. She continues to take her Plavix as directed. The onset of this condition was acute. The course is constant. Aggravating factors: none. Alleviating factors: none.    Patient is a 52 y.o. female presenting with chest pain and back pain. The history is provided by the patient.  Chest Pain Associated symptoms: back pain   Associated symptoms: no abdominal pain, no cough, no diaphoresis, no fever, no headache, no nausea, no palpitations, no shortness of breath and not vomiting   Back Pain Associated symptoms: chest pain   Associated symptoms: no abdominal pain, no dysuria, no fever and no headaches     Past Medical History  Diagnosis Date  . HTN (hypertension)   . HLD (hyperlipidemia)   . CAD (coronary artery disease)     a. Ant STEMI 10/2012 s/p overlapped DES x 2 to prox-mid LAD, with relook cath showing intramural hematoma mid-distal LAD mg'd medically (unclear if initial mechanism of MI was plaque rupture or dissection).   . Ischemic cardiomyopathy     a. Transient - EF 35% at time of STEMI,  improved to 55-60% by echo days later.  . Carotid artery disease     a. Dopp 10/2012: R <40% ICA stenosis with mild heterogeneous plaque.   Past Surgical History  Procedure Laterality Date  . Coronary angioplasty with stent placement     Family History  Problem Relation Age of Onset  . Heart attack Father 74   History  Substance Use Topics  . Smoking status: Never Smoker   . Smokeless tobacco: Never Used  . Alcohol Use: No   OB History   Grav Para Term Preterm Abortions TAB SAB Ect Mult Living                 Review of Systems  Constitutional: Negative for fever and diaphoresis.  HENT: Negative for sore throat, rhinorrhea and neck pain.   Eyes: Negative for redness.  Respiratory: Negative for cough and shortness of breath.   Cardiovascular: Positive for chest pain. Negative for palpitations and leg swelling.  Gastrointestinal: Negative for nausea, vomiting, abdominal pain and diarrhea.  Genitourinary: Negative for dysuria.  Musculoskeletal: Positive for back pain. Negative for myalgias.  Skin: Negative for rash.  Neurological: Negative for syncope, light-headedness and headaches.    Allergies  Lisinopril; Morphine and related; and Penicillins  Home Medications   Current Outpatient Rx  Name  Route  Sig  Dispense  Refill  . aspirin 81 MG tablet   Oral   Take 1 tablet (81 mg total) by mouth daily.         [name]  atorvastatin (LIPITOR) 80 MG tablet   Oral   Take 1 tablet (80 mg total) by mouth daily.   30 tablet   6   . carvedilol (COREG) 6.25 MG tablet   Oral   Take 1 tablet (6.25 mg total) by mouth 2 (two) times daily with a meal.   60 tablet   6   . clopidogrel (PLAVIX) 75 MG tablet   Oral   Take 1 tablet (75 mg total) by mouth daily with breakfast.   30 tablet   6   . desvenlafaxine (PRISTIQ) 100 MG 24 hr tablet   Oral   Take 100 mg by mouth daily.         . fish oil-omega-3 fatty acids 1000 MG capsule   Oral   Take 1 g by mouth daily.          [name] losartan (COZAAR) 25 MG tablet   Oral   Take 1 tablet (25 mg total) by mouth daily.   30 tablet   6   . nitroGLYCERIN (NITROSTAT) 0.4 MG SL tablet   Sublingual   Place 1 tablet (0.4 mg total) under the tongue every 5 (five) minutes as needed for chest pain (up to 3 doses).   25 tablet   4    BP 159/89  Pulse 72  Temp(Src) 98.3 F (36.8 C) (Oral)  Resp 16  SpO2 99% Physical Exam  Nursing note and vitals reviewed. Constitutional: She appears well-developed and well-nourished.  HENT:  Head: Normocephalic and atraumatic.  Mouth/Throat: Mucous membranes are normal. Mucous membranes are not dry.  Eyes: Conjunctivae are normal.  Neck: Trachea normal and normal range of motion. Neck supple. Normal carotid pulses and no JVD present. No muscular tenderness present. Carotid bruit is not present. No tracheal deviation present.  Cardiovascular: Normal rate, regular rhythm, S1 normal, S2 normal, normal heart sounds and intact distal pulses.  Exam reveals no decreased pulses.   No murmur heard. Pulmonary/Chest: Effort normal. No respiratory distress. She has no wheezes. She exhibits no tenderness.  Abdominal: Soft. Normal aorta and bowel sounds are normal. There is no tenderness. There is no rebound and no guarding.  Musculoskeletal: Normal range of motion.  Neurological: She is alert.  Skin: Skin is warm and dry. She is not diaphoretic. No cyanosis. No pallor.  Psychiatric: She has a normal mood and affect.    ED Course   Procedures (including critical care time)  Labs Reviewed  BASIC METABOLIC PANEL - Abnormal; Notable for the following:    GFR calc non Af Amer 81 (*)    All other components within normal limits  CBC  TROPONIN I  POCT I-STAT TROPONIN I   Dg Chest 2 View  04/02/2013   *RADIOLOGY REPORT*  Clinical Data: Chest/back pain  CHEST - 2 VIEW  Comparison: 10/18/2012  Findings: Lungs are clear. No pleural effusion or pneumothorax.  Cardiomediastinal silhouette is within  normal limits.  Coronary stent.  Mild degenerative changes of the visualized thoracolumbar spine.  IMPRESSION: No evidence of acute cardiopulmonary disease.   Original Report Authenticated By: Charline Bills, M.D.   1. Chest pain     7:49 PM Patient seen and examined. Work-up initiated. Medications ordered.   Vital signs reviewed and are as follows: Filed Vitals:   04/02/13 1916  BP: 126/79  Pulse: 64  Temp:   Resp: 18    Date: 04/02/2013  Rate: 78  Rhythm: normal sinus rhythm  QRS Axis: normal  Intervals: normal  ST/T  Wave abnormalities: normal  Conduction Disutrbances:none  Narrative Interpretation:   Old EKG Reviewed: changes noted from 10/19/2012, improvement in lateral t-wave inversion  10:49 PM Patient d/w and seen by Dr. Jeraldine Loots. 2nd troponin ordered. Discussed earlier by telephone with Dr. Mayford Knife who asks for hospitalist admit for rule-out.   11:51 PM Spoke with Family Practice resident who will see and admit.    MDM  Admit to [facility] for rule-out given h/o CAD/stents. Stable and well-appearing in ED.     Renne Crigler, PA-C 04/02/13 2352

## 2013-04-02 NOTE — H&P (Signed)
Family Medicine Teaching [facility] Admission History and Physical Service Pager: [phone]  Patient name: Deanna Hogan Medical record number: [id number] Date of birth: Nov 07, 1960 Age: 52 y.o. Gender: female  Primary Care Provider: Dalbert Mayotte, MD Consultants: None Code Status: Full Code  Chief Complaint: Chest pain  Assessment and Plan: Deanna Hogan is a 52 y.o. year old female presenting with chest pain . PMH is significant for STEMI s/p 2 stent placement, hypertension, hyperlipidemia, coronary artery disease. She is currently experiencing 4/10 chest pain on morphine and is stable.  Chest Pain - Currently has chest pressure 4/10. Troponin was negative x1 in the ED.  - with her cardiac history will admit for cardiac rule out, also consider PE, chest wall pain, PUD or GERD, and anxiety  - unlikely PE without tachycardia and only mild intermittent dyspnea - admit to 3W telemetry - admitting attending is Dr. Mauricio Po - Risk stratification labs: TSH, lipid panel, A1C - trend 2 additional  troponins to rule out cardiac muscle damage - AM EKG to monitor for ischemic changes - needs to follow-up outpatient with Dr. Excell Seltzer in one week - morphine 2mg  PRN for pain - aspirin 81mg  [ ]  f/u TSH, lipid panel, A1C [ ]  f/u troponin [ ]  f/u AM EKG  Hx of STEMI - MI in February 2014 with 2 stents placed. Followed by cardiologist Dr. Excell Seltzer. Currently on plavix. - continue home plavix 75mg   Hypertension - currently normotensive. - continue home carvedilol 6.25 BID  Hyperlipdemia - continue home lipitor 80mg   FEN/GI: heart healthy Prophylaxis: Lovenox  Disposition: Admit to telemetry  History of Present Illness: Deanna Hogan is a 52 y.o. year old female presenting with 4/10 substernal chest pressure Symptoms started on Thursday with more of a chest pinching sensation with associated back pain, headache with intermittent shortness of breath, lightheadedness, nausea and jaw pain.  She states that after the morphine was given her pain changed to more of a pressure sensation with inconsistent radiation to her left shoulder down to her hand. She took two nitroglycerin tablets with no alleviation of pain. There are no aggravating or alleviating factors but she has not exerted herself in 3 days. No abdominal pain, no diarrhea, no constipation. She received morphine in the ED which only helped minimally. She has a history of STEMI in February 2014, for which she had 2 stents placed.  Review Of Systems: Per HPI with the following additions:  Otherwise 12 point review of systems was performed and was unremarkable.  Patient Active Problem List   Diagnosis Date Noted  . CAD (coronary artery disease) 10/21/2012  . STEMI (ST elevation myocardial infarction) 10/18/2012  . HTN (hypertension) 10/18/2012  . HLD (hyperlipidemia) 10/18/2012   Past Medical History: Past Medical History  Diagnosis Date  . HTN (hypertension)   . HLD (hyperlipidemia)   . CAD (coronary artery disease)     a. Ant STEMI 10/2012 s/p overlapped DES x 2 to prox-mid LAD, with relook cath showing intramural hematoma mid-distal LAD mg'd medically (unclear if initial mechanism of MI was plaque rupture or dissection).   . Ischemic cardiomyopathy     a. Transient - EF 35% at time of STEMI, improved to 55-60% by echo days later.  . Carotid artery disease     a. Dopp 10/2012: R <40% ICA stenosis with mild heterogeneous plaque.   Past Surgical History: Past Surgical History  Procedure Laterality Date  . Coronary angioplasty with stent placement     Social History:  History  Substance Use Topics  . Smoking status: Never Smoker   . Smokeless tobacco: Never Used  . Alcohol Use: No   Additional social history:   Please also refer to relevant sections of EMR.  Family History: Family History  Problem Relation Age of Onset  . Heart attack Father 109   Allergies and Medications: Allergies  Allergen Reactions  .  Lisinopril Cough  . Morphine And Related Itching    Itching subsided with benadryl  . Penicillins Rash   No current facility-administered medications on file prior to encounter.   Current Outpatient Prescriptions on File Prior to Encounter  Medication Sig Dispense Refill  . aspirin 81 MG tablet Take 1 tablet (81 mg total) by mouth daily.      [name] atorvastatin (LIPITOR) 80 MG tablet Take 1 tablet (80 mg total) by mouth daily.  30 tablet  6  . carvedilol (COREG) 6.25 MG tablet Take 1 tablet (6.25 mg total) by mouth 2 (two) times daily with a meal.  60 tablet  6  . clopidogrel (PLAVIX) 75 MG tablet Take 1 tablet (75 mg total) by mouth daily with breakfast.  30 tablet  6  . desvenlafaxine (PRISTIQ) 100 MG 24 hr tablet Take 100 mg by mouth daily.      [name] losartan (COZAAR) 25 MG tablet Take 1 tablet (25 mg total) by mouth daily.  30 tablet  6  . nitroGLYCERIN (NITROSTAT) 0.4 MG SL tablet Place 1 tablet (0.4 mg total) under the tongue every 5 (five) minutes as needed for chest pain (up to 3 doses).  25 tablet  4    Objective: BP 122/75  Pulse 56  Temp(Src) 98.3 F (36.8 C) (Oral)  Resp 15  SpO2 97% Exam: General: Laying in bed comfortable in no acute distress HEENT: pupils equal, round, reactive to light; moist mucous membranes, BL conjunctival injection Cardiovascular: RRR, no murmur Musculoskeletal: No chest wall or back tenderness Respiratory: Clear to auscultation bilaterally, no wheeze Abdomen: soft, non-tender, non-distended Extremities: No edema Skin: No cyanosis Neuro: alert & oriented x4. Having regular conversation, no focal deficits  Labs and Imaging: CBC BMET   Recent Labs Lab 04/02/13 1925  WBC 7.0  HGB 14.4  HCT 41.5  PLT 222    Recent Labs Lab 04/02/13 1925  NA 136  K 3.8  CL 100  CO2 27  BUN 10  CREATININE 0.83  GLUCOSE 81  CALCIUM 8.9      Chest X-ray Findings: Lungs are clear. No pleural effusion or pneumothorax.  Cardiomediastinal silhouette is  within normal limits. Coronary  stent.  Mild degenerative changes of the visualized thoracolumbar spine.  IMPRESSION:  No evidence of acute cardiopulmonary disease  EKG (8/2): Normal sinus rhythm. Normal axis. Flattened t-waves in V1, V5, V6. Compared to EKG in 10/2012, t-wave inversion is improved  Jacquelin Hawking, MD 04/02/2013, 11:57 PM PGY-1, [facility] Family Medicine FPTS Intern pager: [phone], text pages welcome  I have examined Deanna Hogan with Dr. Caleb Popp and I agree with his documentation as stated aboved. My edits are noted in [facility].   Murtis Sink, MD [facility] Family Medicine Resident, PGY-2 04/03/2013, 1:38 AM FPTS Intern pager: [phone], text pages welcome

## 2013-04-03 DIAGNOSIS — I2510 Atherosclerotic heart disease of native coronary artery without angina pectoris: Secondary | ICD-10-CM

## 2013-04-03 DIAGNOSIS — E785 Hyperlipidemia, unspecified: Secondary | ICD-10-CM

## 2013-04-03 DIAGNOSIS — R079 Chest pain, unspecified: Secondary | ICD-10-CM

## 2013-04-03 LAB — HEMOGLOBIN A1C: Mean Plasma Glucose: 111 mg/dL (ref <117)

## 2013-04-03 LAB — TSH: TSH: 5.132 u[IU]/mL — ABNORMAL HIGH (ref 0.350–4.500)

## 2013-04-03 LAB — LIPID PANEL
HDL: 48 mg/dL (ref >39)
Triglycerides: 140 mg/dL (ref <150)

## 2013-04-03 LAB — TROPONIN I: Troponin I: <0.3 ng/mL (ref <0.30)

## 2013-04-03 MED ORDER — ONDANSETRON HCL 4 MG/2ML IJ SOLN: 4.0000 mg | Freq: Four times a day (QID) | INTRAMUSCULAR | Status: DC | PRN

## 2013-04-03 MED ORDER — NITROGLYCERIN 0.4 MG SL SUBL: 0.4000 mg | SUBLINGUAL_TABLET | SUBLINGUAL | Status: DC | PRN

## 2013-04-03 MED ORDER — ARTIFICIAL TEARS OP OINT: TOPICAL_OINTMENT | OPHTHALMIC | Status: DC | PRN

## 2013-04-03 MED ORDER — VENLAFAXINE HCL ER 150 MG PO CP24
150.0000 mg | ORAL_CAPSULE | Freq: Every day | ORAL | Status: DC
  Administered 2013-04-03 – 2013-04-04 (×2): 150 mg via ORAL
  Filled 2013-04-03 (×3): qty 1

## 2013-04-03 MED ORDER — ACETAMINOPHEN 325 MG PO TABS: 650.0000 mg | ORAL_TABLET | ORAL | Status: DC | PRN

## 2013-04-03 MED ORDER — CLOPIDOGREL BISULFATE 75 MG PO TABS
75.0000 mg | ORAL_TABLET | Freq: Every day | ORAL | Status: DC
  Administered 2013-04-03 – 2013-04-04 (×2): 75 mg via ORAL
  Filled 2013-04-03 (×3): qty 1

## 2013-04-03 MED ORDER — POLYVINYL ALCOHOL 1.4 % OP SOLN
1.0000 [drp] | OPHTHALMIC | Status: DC | PRN
  Administered 2013-04-03: 1 [drp] via OPHTHALMIC
  Filled 2013-04-03: qty 15

## 2013-04-03 MED ORDER — ATORVASTATIN CALCIUM 80 MG PO TABS
80.0000 mg | ORAL_TABLET | Freq: Every day | ORAL | Status: DC
  Administered 2013-04-03 – 2013-04-04 (×2): 80 mg via ORAL
  Filled 2013-04-03 (×2): qty 1

## 2013-04-03 MED ORDER — LOSARTAN POTASSIUM 25 MG PO TABS
25.0000 mg | ORAL_TABLET | Freq: Every day | ORAL | Status: DC
  Administered 2013-04-03 – 2013-04-04 (×2): 25 mg via ORAL
  Filled 2013-04-03 (×2): qty 1

## 2013-04-03 MED ORDER — ASPIRIN 81 MG PO CHEW
81.0000 mg | CHEWABLE_TABLET | Freq: Every day | ORAL | Status: DC
  Administered 2013-04-03 – 2013-04-04 (×2): 81 mg via ORAL
  Filled 2013-04-03 (×3): qty 1

## 2013-04-03 MED ORDER — ENOXAPARIN SODIUM 40 MG/0.4ML ~~LOC~~ SOLN
40.0000 mg | SUBCUTANEOUS | Status: DC
  Administered 2013-04-03: 40 mg via SUBCUTANEOUS
  Filled 2013-04-03 (×2): qty 0.4

## 2013-04-03 MED ORDER — MORPHINE SULFATE 2 MG/ML IJ SOLN
2.0000 mg | INTRAMUSCULAR | Status: DC | PRN
  Administered 2013-04-03 – 2013-04-04 (×4): 2 mg via INTRAVENOUS
  Filled 2013-04-03 (×4): qty 1

## 2013-04-03 MED ORDER — CARVEDILOL 6.25 MG PO TABS
6.2500 mg | ORAL_TABLET | Freq: Two times a day (BID) | ORAL | Status: DC
Start: 2013-04-03 — End: 2013-04-04
  Administered 2013-04-03 – 2013-04-04 (×3): 6.25 mg via ORAL
  Filled 2013-04-03 (×5): qty 1

## 2013-04-03 NOTE — Progress Notes (Signed)
Patient seen and examined by me, discussed with Dr Ermalinda Memos and I agree with plan.  I have added additional notes to the H&P. Paula Compton, MD

## 2013-04-03 NOTE — ED Provider Notes (Signed)
  This was a shared visit with a mid-level provided (NP or PA).  Throughout the patient's course I was available for consultation/collaboration.  I saw the ECG (if appropriate), relevant labs and studies - I agree with the interpretation.  On my exam the patient was in no distress.  However, she was admitted for further evaluation and management given her cardiac history.      Gerhard Munch, MD 04/03/13 0001

## 2013-04-03 NOTE — Consult Note (Signed)
Reason for Consult:chest pressure   Referring Physician: Dr. Kristine Garbe is an 52 y.o. female.   HPI: The patient is a 52 yo woman with a h/o known CAD, s/p Anterior MI on presentaton in February of 2014. She received 2 overlapping stents to the LAD and procedure complicated by intramural hematoma. She initially had moderate LV dysfunction, EF 35%. She had repeat echo on medical therapy which demonstrated normalization of her EF. Since she has done well until yesterday when she redeveloped the exact same symptoms that were present with her MI except that they were not as severe. She had chest pressure, neck pain and sob with associated sob and nausea. She has ruled out for MI. No acute ECG changes. No relation to food intake or position. She was not exerting herself when the symptoms occurred.   PMH: Past Medical History  Diagnosis Date  . HTN (hypertension)   . HLD (hyperlipidemia)   . CAD (coronary artery disease)     a. Ant STEMI 10/2012 s/p overlapped DES x 2 to prox-mid LAD, with relook cath showing intramural hematoma mid-distal LAD mg'd medically (unclear if initial mechanism of MI was plaque rupture or dissection).   . Ischemic cardiomyopathy     a. Transient - EF 35% at time of STEMI, improved to 55-60% by echo days later.  . Carotid artery disease     a. Dopp 10/2012: R <40% ICA stenosis with mild heterogeneous plaque.    PSHX: Past Surgical History  Procedure Laterality Date  . Coronary angioplasty with stent placement      FAMHX: Family History  Problem Relation Age of Onset  . Heart attack Father 47    Social History:  reports that she has never smoked. She has never used smokeless tobacco. She reports that she does not drink alcohol or use illicit drugs.  Allergies:  Allergies  Allergen Reactions  . Lisinopril Cough  . Morphine And Related Itching    Itching subsided with benadryl  . Penicillins Rash    Medications: reviewed  Dg Chest 2  View  04/02/2013   *RADIOLOGY REPORT*  Clinical Data: Chest/back pain  CHEST - 2 VIEW  Comparison: 10/18/2012  Findings: Lungs are clear. No pleural effusion or pneumothorax.  Cardiomediastinal silhouette is within normal limits.  Coronary stent.  Mild degenerative changes of the visualized thoracolumbar spine.  IMPRESSION: No evidence of acute cardiopulmonary disease.   Original Report Authenticated By: Charline Bills, M.D.    ROS  As stated in the HPI and negative for all other systems.  Physical Exam  Vitals:Blood pressure 105/63, pulse 60, temperature 97.8 F (36.6 C), temperature source Oral, resp. rate 19, weight 201 lb (91.173 kg), SpO2 93.00%.  Well appearing middle aged woman, NAD HEENT: Unremarkable Neck:  7 cm JVD, no thyromegally Lungs:  Clear with no wheezes, rales, or rhonchi HEART:  Regular rate rhythm, no murmurs, no rubs, no clicks Abd:  Flat, positive bowel sounds, no organomegally, no rebound, no guarding Ext:  2 plus pulses, no edema, no cyanosis, no clubbing Skin:  No rashes no nodules Neuro:  CN II through XII intact, motor grossly intact  ECG - NSR  Assessment/Plan: 1. Recurrent chest pressure worrisome for an acute coronary syndrome with current symptoms not as severe as prior symptoms but identical in quality. I have discussed the pro's and con's of stress testing vs cardiac catheterization. Because she had an intramural hematom, I would be more inclined to recommend cardiac cath. Will discuss  with Dr. Excell Seltzer who will take over care tomorrow. Will keep NPO.  Sharlot Gowda TaylorMD 04/03/2013, 12:33 PM

## 2013-04-03 NOTE — Progress Notes (Signed)
Family Medicine Teaching Service Daily Progress Note Intern Pager: [phone]  Patient name: Deanna Hogan Medical record number: [id number] Date of birth: November 18, 1960 Age: 52 y.o. Gender: female  Primary Care Provider: Dalbert Mayotte, MD Consultants: none Code Status: Full  Pt Overview and Major Events to Date: 52 y/o female here with chest pain and heart score of 6 with recent Hx of STEMI in 10/2012.   Assessment and Plan:Deanna Hogan is a 52 y.o. year old female presenting with chest pain . PMH is significant for STEMI s/p 2 stent placement, hypertension, hyperlipidemia, coronary artery disease. She is currently experiencing 4/10 chest pain on morphine and is stable.  Chest Pain - Currently has chest pressure 4/10. Troponin was negative x1 in the ED.  - Chest pain continued but improving - Risk stratification labs:   - TSH, and A1C pending  - Lipid panel with LDL 82 and HDL of 48 - Troponins negative X 2 - AM EKG to be performed - morphine 2mg  PRN for pain  - aspirin 81mg   - [facility] consulted - appreciate recommendations.   - Pt made NPO for Myoview  - Pt has eaten breakfast today [ ]  f/u AM EKG   Hx of STEMI  - MI in February 2014 with 2 stents placed. Followed by cardiologist Dr. Excell Seltzer at [facility].  - continue home plavix 75mg  and lipitor  Hypertension - 105/63 this am - continue home carvedilol 6.25 BID   Hyperlipdemia  - continue home lipitor 80mg    FEN/GI: heart healthy diet  Prophylaxis: Lovenox   Disposition: Admit to telemetry  Subjective:  Pt with continued but improved chest and upper back pain. She denies persistent nausea or dyspnea.   Objective: Temp:  [97.8 F (36.6 C)-98.3 F (36.8 C)] 97.8 F (36.6 C) (08/03 0517) Pulse Rate:  [56-72] 60 (08/03 0517) Resp:  [15-22] 19 (08/03 0517) BP: (105-159)/(61-92) 105/63 mmHg (08/03 0517) SpO2:  [93 %-100 %] 93 % (08/03 0517) Weight:  [201 lb (91.173 kg)] 201 lb (91.173 kg) (08/03  0123) Physical Exam: Gen: NAD, alert, cooperative with exam HEENT: NCAT CV: RRR, good S1/S2, no murmur Resp: CTABL, no wheezes, non-labored Abd: SNTND, BS present, no guarding or organomegaly Ext: No edema, warm Neuro: Alert and oriented, No gross deficits   Laboratory:  Recent Labs Lab 04/02/13 1925  WBC 7.0  HGB 14.4  HCT 41.5  PLT 222    Recent Labs Lab 04/02/13 1925  NA 136  K 3.8  CL 100  CO2 27  BUN 10  CREATININE 0.83  CALCIUM 8.9  GLUCOSE 81       Recent Labs Lab 04/02/13 2245 04/03/13 0655  TROPONINI <0.30 <0.30    Imaging/Diagnostic Tests:  Chest X-ray  Findings: Lungs are clear. No pleural effusion or pneumothorax.  Cardiomediastinal silhouette is within normal limits. Coronary  stent.  Mild degenerative changes of the visualized thoracolumbar spine.  IMPRESSION:  No evidence of acute cardiopulmonary disease   EKG (8/2): Normal sinus rhythm. Normal axis. Flattened t-waves in V1, V5, V6. Compared to EKG in 10/2012, t-wave inversion is improved   Elenora Gamma, MD 04/03/2013, 6:59 AM PGY-2, [facility] Family Medicine FPTS Intern pager: [phone], text pages welcome

## 2013-04-03 NOTE — H&P (Signed)
FMTS Attending Admit Note Patient seen and examined by me, discussed with Dr Ermalinda Memos and I agree with his assessment and plan.  Patient admitted with chest pain beginning Thurs, July 31 that was/is similar in character to the pain she experienced with her NSTEMI in Feb 2014 (albeit less intense).  Thus far in admission her TropI has been negative x2, without significant changes in her ECG.  This morning she continues with some discomfort in the chest (grades it "4" on 10-pt scale); was "5" yesterday at presentation.  Some dyspnea, which is new for her.  No fevers or chills.  Assess/Plan: Patient with known CAD s/p NSTEMI Feb 2014, now admitted with chest pain. She has been on Plavix and ASA since NSTEMI.  Will contact her Cardiology service [facility]) for consult this morning. She has been maintained on BB and ARB (ACEI produced cough).  No tachycardia on vitals since admission. CXR at admission without findings to explain dyspnea.  Paula Compton, MD

## 2013-04-04 ENCOUNTER — Encounter (HOSPITAL_COMMUNITY)
Admission: EM | Disposition: A | Discharge status: Home / Self Care | Payer: Self-pay | Source: Home / Self Care | Attending: Emergency Medicine

## 2013-04-04 DIAGNOSIS — R079 Chest pain, unspecified: Secondary | ICD-10-CM

## 2013-04-04 DIAGNOSIS — I10 Essential (primary) hypertension: Secondary | ICD-10-CM

## 2013-04-04 DIAGNOSIS — E782 Mixed hyperlipidemia: Secondary | ICD-10-CM

## 2013-04-04 HISTORY — PX: LEFT HEART CATHETERIZATION WITH CORONARY ANGIOGRAM: SHX5451

## 2013-04-04 LAB — T4, FREE: Free T4: 0.94 ng/dL (ref 0.80–1.80)

## 2013-04-04 SURGERY — LEFT HEART CATHETERIZATION WITH CORONARY ANGIOGRAM
Anesthesia: LOCAL

## 2013-04-04 MED ORDER — MIDAZOLAM HCL 2 MG/2ML IJ SOLN
INTRAMUSCULAR | Status: AC
  Filled 2013-04-04: qty 2

## 2013-04-04 MED ORDER — SENNOSIDES-DOCUSATE SODIUM 8.6-50 MG PO TABS
2.0000 | ORAL_TABLET | Freq: Every evening | ORAL | Status: DC | PRN
  Administered 2013-04-04: 2 via ORAL
  Filled 2013-04-04: qty 2

## 2013-04-04 MED ORDER — SODIUM CHLORIDE 0.9 % IV SOLN
INTRAVENOUS | Status: DC
  Administered 2013-04-04: 10:00:00 via INTRAVENOUS

## 2013-04-04 MED ORDER — SODIUM CHLORIDE 0.9 % IV SOLN: 1.0000 mL/kg/h | INTRAVENOUS | Status: DC

## 2013-04-04 MED ORDER — SODIUM CHLORIDE 0.9 % IJ SOLN: 3.0000 mL | INTRAMUSCULAR | Status: DC | PRN

## 2013-04-04 MED ORDER — CHLORHEXIDINE GLUCONATE CLOTH 2 % EX PADS: 6.0000 | MEDICATED_PAD | Freq: Every day | CUTANEOUS | Status: DC

## 2013-04-04 MED ORDER — NITROGLYCERIN 0.2 MG/ML ON CALL CATH LAB
INTRAVENOUS | Status: AC
  Filled 2013-04-04: qty 1

## 2013-04-04 MED ORDER — DIAZEPAM 5 MG PO TABS
5.0000 mg | ORAL_TABLET | ORAL | Status: AC
  Administered 2013-04-04: 5 mg via ORAL
  Filled 2013-04-04: qty 1

## 2013-04-04 MED ORDER — VERAPAMIL HCL 2.5 MG/ML IV SOLN
INTRAVENOUS | Status: AC
  Filled 2013-04-04: qty 2

## 2013-04-04 MED ORDER — MUPIROCIN 2 % EX OINT: 1.0000 "application " | TOPICAL_OINTMENT | Freq: Two times a day (BID) | CUTANEOUS | Status: DC

## 2013-04-04 MED ORDER — SODIUM CHLORIDE 0.9 % IV SOLN: 250.0000 mL | INTRAVENOUS | Status: DC | PRN

## 2013-04-04 MED ORDER — HEPARIN (PORCINE) IN NACL 2-0.9 UNIT/ML-% IJ SOLN
INTRAMUSCULAR | Status: AC
  Filled 2013-04-04: qty 1000

## 2013-04-04 MED ORDER — SODIUM CHLORIDE 0.9 % IJ SOLN
3.0000 mL | Freq: Two times a day (BID) | INTRAMUSCULAR | Status: DC
  Administered 2013-04-04: 3 mL via INTRAVENOUS

## 2013-04-04 MED ORDER — FENTANYL CITRATE 0.05 MG/ML IJ SOLN
INTRAMUSCULAR | Status: AC
  Filled 2013-04-04: qty 2

## 2013-04-04 MED ORDER — LIDOCAINE HCL (PF) 1 % IJ SOLN
INTRAMUSCULAR | Status: AC
  Filled 2013-04-04: qty 30

## 2013-04-04 NOTE — CV Procedure (Signed)
   Cardiac Catheterization Procedure Note  Name: Deanna Hogan MRN: [id number] DOB: 30-Apr-1961  Procedure: Left Heart Cath, Selective Coronary Angiography, LV angiography, radial artery access under ultrasound guidance  Indication: chest pain, concern for unstable angina. Sx's like those of her prior MI.   Procedural Details: The right wrist was prepped, draped, and anesthetized with 1% lidocaine. Using the modified Seldinger technique, a 5 French sheath was introduced into the right radial artery. The radial artery was difficult to palpate. Ultrasound guidance was utilized. 3 mg of verapamil was administered through the sheath, weight-based unfractionated heparin was administered intravenously. Standard Judkins catheters were used for selective coronary angiography and left ventriculography. Catheter exchanges were performed over an exchange length guidewire. There were no immediate procedural complications. A TR band was used for radial hemostasis at the completion of the procedure.  The patient was transferred to the post catheterization recovery area for further monitoring.  Procedural Findings: Hemodynamics: AO 127/77 LV 126/16  Coronary angiography: Coronary dominance: right  Left mainstem: Arises from left cusp. Patent without significant disease.  Left anterior descending (LAD): patent throughout. Large vessel reaches LV apex. Overlapping stents in mid-vessel patent without restenosis.  Left circumflex (LCx): Large vessel. Minor plaque in mid-vessel. OM1 is a large vessel without stenosis  Right coronary artery (RCA): Large, dominant vessel. Widely patent without obstructive disease. PDA patent without disease.  Left ventriculography: Left ventricular systolic function is normal, LVEF is estimated at 55-65%, there is no significant mitral regurgitation   Final Conclusions:   1. Widely patent stents in LAD 2. No obstructive CAD 3. Normal LV function  Recommendations:  continue medical therapy. D/c home this afternoon.  Tonny Bollman 04/04/2013, 3:12 PM

## 2013-04-04 NOTE — Progress Notes (Addendum)
Family Medicine Teaching Service Daily Progress Note Intern Pager: [phone]  Patient name: Deanna Hogan Medical record number: [id number] Date of birth: 13-Jun-1961 Age: 52 y.o. Gender: female  Primary Care Provider: Dalbert Mayotte, MD Consultants: none Code Status: Full  Pt Overview and Major Events to Date: 52 y/o female here with chest pain and heart score of 6 with recent Hx of STEMI in 10/2012.   Assessment and Plan: Deanna Hogan is a 52 y.o. year old female presenting with chest pain. PMH is significant for STEMI s/p 2 stent placement, hypertension, hyperlipidemia, coronary artery disease. She is currently experiencing 4-5/10 chest pain on morphine and is stable.  Chest Pain - Currently has chest pressure 4-5/10. Troponin is negative x3. No ST-segment changes on EKG. - Chest pain continued - Risk stratification labs:   - TSH: 5.132  - A1C: 5.5  - Lipid panel with LDL 82 and HDL of 48 - Troponin negative x3 - morphine 2mg  PRN for pain  - aspirin 81mg   - [facility] consulted - appreciate recommendations.   - Pt made NPO for Myoview vs cath [ ]  f/u Myoview vs cath results  High TSH - possibly hypothyroidism. Could contribute to chest pain symptoms. - free T4 ordered [ ]  follow up free T4    Hx of STEMI  - MI in February 2014 with 2 stents placed. Followed by cardiologist Dr. Excell Seltzer at [place].  - continue home asa and plavix 75mg  and lipitor  Hypertension, currently stable - 112/70 this am - continue home carvedilol 6.25 BID   Hyperlipdemia - continue home lipitor 80mg    FEN/GI: NPO overnight Prophylaxis: Lovenox   Disposition: pending results of Myoview vs cath   Subjective: Still having chest pain rated 4-5/10 over night with radiation to back, jaw and shoulder. Morphine does not improve symptoms. No alleviating/aggravating factors. Says she prefers a cath over Myoview and will be speaking with Dr. Excell Seltzer today.   Objective: Temp:  [97.7 F (36.5  C)-98.2 F (36.8 C)] 97.7 F (36.5 C) (08/04 0446) Pulse Rate:  [64-69] 64 (08/04 0446) Resp:  [19-20] 20 (08/04 0446) BP: (112-124)/(64-70) 112/70 mmHg (08/04 0446) SpO2:  [93 %-98 %] 96 % (08/04 0446)  Physical Exam:  Gen: NAD, comfortable in bed, mother at bedside CV: RRR, good S1/S2, no murmur Resp: Clear to auscultation bilaterally, no wheezes, non-labored Abd: non-tender, non-distended, bowel sounds present, no guarding or organomegaly Ext: No edema, warm Neuro: Alert and oriented, No gross deficits  Laboratory:  Recent Labs Lab 04/02/13 1925  WBC 7.0  HGB 14.4  HCT 41.5  PLT 222    Recent Labs Lab 04/02/13 1925  NA 136  K 3.8  CL 100  CO2 27  BUN 10  CREATININE 0.83  CALCIUM 8.9  GLUCOSE 81      Recent Labs Lab 04/02/13 2245 04/03/13 0655 04/03/13 1034  TROPONINI <0.30 <0.30 <0.30    Imaging/Diagnostic Tests:  Chest X-ray  Findings: Lungs are clear. No pleural effusion or pneumothorax.  Cardiomediastinal silhouette is within normal limits. Coronary  stent.  Mild degenerative changes of the visualized thoracolumbar spine.  IMPRESSION:  No evidence of acute cardiopulmonary disease   EKG (8/2): Normal sinus rhythm. Normal axis. Flattened t-waves in V1, V5, V6. Compared to EKG in 10/2012, t-wave inversion is improved  EKG (8/3): Normal Sinus rhythm. Similar to EKG on 8/2   Jacquelin Hawking, MD 04/04/2013, 6:09 AM PGY-1, [facility] Family Medicine FPTS Intern pager: [phone], text pages welcome  Attending Addendum  I examined the patient and discussed the assessment and plan with Dr. Caleb Popp. I have reviewed the note, made necessary revisions in bold print and agree with above.   Briefly, 52 yo F with CAD s/p CATH with chest pain. Hemodynamically stable, neg trop, no ST changes on schedule for left heart cath today. Plan to continue current regimen including ASA and plavix as antiplatelet therapy (combo bc of recent stent placements), statin and  BP control with BB and ARB. Will f/u cath results to guide dispo plan.   Kimi Kroft 04/04/2013, 2:30 PM

## 2013-04-04 NOTE — Discharge Summary (Signed)
Family Medicine Teaching [facility] Discharge Summary  Patient name: Deanna Hogan Medical record number: [id number] Date of birth: February 13, 1961 Age: 52 y.o. Gender: female Date of Admission: 04/02/2013  Date of Discharge: 04/04/13 Admitting Physician: Barbaraann Barthel, MD  Primary Care Provider: Dalbert Mayotte, MD Consultants: Cardiology  Indication for Hospitalization: Chest pain  Discharge Diagnoses/Problem List:  1. Chest Pain 2. Hx of STEMI 3. Hypertension 4. Hyperlipidemia  Disposition: Home  Discharge Condition: Stable  Discharge Exam:  Gen: NAD, comfortable in bed, mother at bedside  CV: RRR, good S1/S2, no murmur  Resp: Clear to auscultation bilaterally, no wheezes, non-labored  Abd: non-tender, non-distended, bowel sounds present, no guarding or organomegaly  Ext: No edema, warm  Neuro: Alert and oriented, No gross deficits  Brief Hospital Course:  1. Chest Pain - Came in with 4/10 chest pressure with nausea, intermittent shortness of breath and pain that radiated to back between scapulae, to her left jaw, left shoulder and fingers. She took 2 nitro at home with no alleviation. While admitted, she received morphine which did not help pain. Troponin was negative x3, EKGs showed normal sinus rhythm with flattened T-waves in V5, V6, which was an improvement over inverted T-waves in prior EKG. Her pain did not resolve. Cardiology was consulted and performed a cardiac catheterization which did not show any artery occlusion. Follow-up with cardiology outpatient was arranged.She was discharged on home medication. 2. Hx of STEMI - continued home Aspirin, Plavix and high dose Lipitor 3. Hypertension - continued on home carvedilol and losartan. Her blood pressure was stable during admission.  Issues for Follow Up:  1. Constant chest pain not relieved by morphine/nitro 2. New subclinical hypothyroidism  Significant Procedures:  1. Cardiac catheterization  Final Conclusions:   1.  Widely patent stents in LAD   2. No obstructive CAD   3. Normal LV function  Significant Labs and Imaging:   Recent Labs Lab 04/02/13 1925  WBC 7.0  HGB 14.4  HCT 41.5  PLT 222    Recent Labs Lab 04/02/13 1925  NA 136  K 3.8  CL 100  CO2 27  GLUCOSE 81  BUN 10  CREATININE 0.83  CALCIUM 8.9   Cardiac Panel (last 3 results)  Recent Labs  04/02/13 2245 04/03/13 0655 04/03/13 1034  TROPONINI <0.30 <0.30 <0.30   Chest X-ray (8/2) Findings: Lungs are clear. No pleural effusion or pneumothorax.  Cardiomediastinal silhouette is within normal limits. Coronary  stent.  Mild degenerative changes of the visualized thoracolumbar spine.  IMPRESSION:  No evidence of acute cardiopulmonary disease   EKG (8/2): Normal sinus rhythm. Normal axis. Flattened t-waves in V1, V5, V6. Compared to EKG in 10/2012, t-wave inversion is improved   EKG (8/3): Normal Sinus rhythm. Similar to EKG on 8/2   Outstanding Results: None  Discharge Medications:    Medication List         aspirin 81 MG tablet  Take 1 tablet (81 mg total) by mouth daily.     atorvastatin 80 MG tablet  Commonly known as:  LIPITOR  Take 1 tablet (80 mg total) by mouth daily.     carvedilol 6.25 MG tablet  Commonly known as:  COREG  Take 1 tablet (6.25 mg total) by mouth 2 (two) times daily with a meal.     clopidogrel 75 MG tablet  Commonly known as:  PLAVIX  Take 1 tablet (75 mg total) by mouth daily with breakfast.     desvenlafaxine 100 MG 24  hr tablet  Commonly known as:  PRISTIQ  Take 100 mg by mouth daily.     losartan 25 MG tablet  Commonly known as:  COZAAR  Take 1 tablet (25 mg total) by mouth daily.     nitroGLYCERIN 0.4 MG SL tablet  Commonly known as:  NITROSTAT  Place 1 tablet (0.4 mg total) under the tongue every 5 (five) minutes as needed for chest pain (up to 3 doses).        Discharge Instructions: Please refer to Patient Instructions section of EMR for full details.  Patient  was counseled important signs and symptoms that should prompt return to medical care, changes in medications, dietary instructions, activity restrictions, and follow up appointments.   Follow-Up Appointments: Follow-up Information   Follow up with Dalbert Mayotte, MD. Schedule an appointment as soon as possible for a visit in 1 week.   Contact information:   [address] [place] [address] [phone]       Follow up with Tonny Bollman, MD. (Contact Dr. Earmon Phoenix office for appointment time/date)    Contact information:   [address] [place] [address] [phone]       Jacquelin Hawking, MD 04/04/2013, 10:54 PM PGY-1, [facility] Family Medicine  Attending Addendum  I examined the patient and discussed the assessment and discharge plan with Dr. Caleb Popp. I have reviewed the note, made necessary revisions in and agree with above.  Aspasia Rude 03/05/2013, 8:01 AM

## 2013-04-04 NOTE — Progress Notes (Signed)
    Subjective:  Continues to have some chest and left shoulder discomfort. No dyspnea. Also has had nausea and headache.  Objective:  Vital Signs in the last 24 hours: Temp:  [97.7 F (36.5 C)-98.2 F (36.8 C)] 97.7 F (36.5 C) (08/04 0446) Pulse Rate:  [64-69] 64 (08/04 0446) Resp:  [19-20] 20 (08/04 0446) BP: (112-124)/(64-70) 112/70 mmHg (08/04 0446) SpO2:  [93 %-98 %] 96 % (08/04 0446)  Intake/Output from previous day: 08/03 0701 - 08/04 0700 In: 120 [P.O.:120] Out: 753 [Urine:753]  Physical Exam: Pt is alert and oriented, NAD HEENT: normal Neck: JVP - normal, carotids 2+= without bruits Lungs: CTA bilaterally CV: RRR without murmur or gallop Abd: soft, NT, Positive BS, no hepatomegaly Ext: no C/C/E, distal pulses intact and equal Skin: warm/dry no rash   Lab Results:  Recent Labs  04/02/13 1925  WBC 7.0  HGB 14.4  PLT 222    Recent Labs  04/02/13 1925  NA 136  K 3.8  CL 100  CO2 27  GLUCOSE 81  BUN 10  CREATININE 0.83    Recent Labs  04/03/13 0655 04/03/13 1034  TROPONINI <0.30 <0.30    Cardiac Studies: None  Tele: Sinus rhythm, personally reviewed.  Assessment/Plan:  1. Chest pain with symptoms concerning for unstable angina pectoris. The patient's symptoms are like those of her myocardial infarction. She had intramural hematoma noted and I am concerned about a recurrence. Fortunately her cardiac biomarkers are negative. EKG is nondiagnostic. We discussed diagnostic options including noninvasive stress testing versus diagnostic heart catheterization. After discussion of pros and cons of each approach, we have decided to proceed with cardiac catheterization and possible PCI. I have reviewed the risks, indications, and alternatives to this approach. The patient agrees to proceed. Will schedule for this afternoon.  2. Hypertension. Blood pressure is controlled.  3. Hyperlipidemia. Lipids are viewed. LDL 82, HDL 48. She is on atorvastatin 80  mg.  Disposition: Pending heart catheterization results.   Tonny Bollman, M.D. 04/04/2013, 8:56 AM

## 2013-04-04 NOTE — Discharge Instructions (Signed)
Your heart catheterization looked fine according to Dr. Excell Seltzer. He does not recommend any medication changes at this time. He said you will not have any restrictions as far as work.  Please call your regular doctor in the next 1-2 weeks to schedule an appointment. Also, please call Dr. Earmon Phoenix office in the next week for a follow-up appointment with him.  If your chest pain returns and worsens or your get short of breath please call your doctor or come to the Emergency Dept.  Radial Site Care Refer to this sheet in the next few weeks. These instructions provide you with information on caring for yourself after your procedure. Your caregiver may also give you more specific instructions. Your treatment has been planned according to current medical practices, but problems sometimes occur. Call your caregiver if you have any problems or questions after your procedure. HOME CARE INSTRUCTIONS  You may shower the day after the procedure.Remove the bandage (dressing) and gently wash the site with plain soap and water.Gently pat the site dry.  Do not apply powder or lotion to the site.  Do not submerge the affected site in water for 3 to 5 days.  Inspect the site at least twice daily.  Do not flex or bend the affected arm for 24 hours.  No lifting over 5 pounds (2.3 kg) for 5 days after your procedure.  Do not drive home if you are discharged the same day of the procedure. Have someone else drive you.  You may drive 24 hours after the procedure unless otherwise instructed by your caregiver.  Do not operate machinery or power tools for 24 hours.  A responsible adult should be with you for the first 24 hours after you arrive home. What to expect:  Any bruising will usually fade within 1 to 2 weeks.  Blood that collects in the tissue (hematoma) may be painful to the touch. It should usually decrease in size and tenderness within 1 to 2 weeks. SEEK IMMEDIATE MEDICAL CARE IF:  You have  unusual pain at the radial site.  You have redness, warmth, swelling, or pain at the radial site.  You have drainage (other than a small amount of blood on the dressing).  You have chills.  You have a fever or persistent symptoms for more than 72 hours.  You have a fever and your symptoms suddenly get worse.  Your arm becomes pale, cool, tingly, or numb.  You have heavy bleeding from the site. Hold pressure on the site. Document Released: 09/20/2010 Document Revised: 11/10/2011 Document Reviewed: 09/20/2010 [facility] Patient Information 2014 [place], [place].

## 2013-04-04 NOTE — Progress Notes (Signed)
I  discussed the care plan with Dr Armen Pickup and the [facility] team and agree with assessment and plan as documented in the progress note above.

## 2013-04-04 NOTE — H&P (View-Only) (Signed)
    Subjective:  Continues to have some chest and left shoulder discomfort. No dyspnea. Also has had nausea and headache.  Objective:  Vital Signs in the last 24 hours: Temp:  [97.7 F (36.5 C)-98.2 F (36.8 C)] 97.7 F (36.5 C) (08/04 0446) Pulse Rate:  [64-69] 64 (08/04 0446) Resp:  [19-20] 20 (08/04 0446) BP: (112-124)/(64-70) 112/70 mmHg (08/04 0446) SpO2:  [93 %-98 %] 96 % (08/04 0446)  Intake/Output from previous day: 08/03 0701 - 08/04 0700 In: 120 [P.O.:120] Out: 753 [Urine:753]  Physical Exam: Pt is alert and oriented, NAD HEENT: normal Neck: JVP - normal, carotids 2+= without bruits Lungs: CTA bilaterally CV: RRR without murmur or gallop Abd: soft, NT, Positive BS, no hepatomegaly Ext: no C/C/E, distal pulses intact and equal Skin: warm/dry no rash   Lab Results:  Recent Labs  04/02/13 1925  WBC 7.0  HGB 14.4  PLT 222    Recent Labs  04/02/13 1925  NA 136  K 3.8  CL 100  CO2 27  GLUCOSE 81  BUN 10  CREATININE 0.83    Recent Labs  04/03/13 0655 04/03/13 1034  TROPONINI <0.30 <0.30    Cardiac Studies: None  Tele: Sinus rhythm, personally reviewed.  Assessment/Plan:  1. Chest pain with symptoms concerning for unstable angina pectoris. The patient's symptoms are like those of her myocardial infarction. She had intramural hematoma noted and I am concerned about a recurrence. Fortunately her cardiac biomarkers are negative. EKG is nondiagnostic. We discussed diagnostic options including noninvasive stress testing versus diagnostic heart catheterization. After discussion of pros and cons of each approach, we have decided to proceed with cardiac catheterization and possible PCI. I have reviewed the risks, indications, and alternatives to this approach. The patient agrees to proceed. Will schedule for this afternoon.  2. Hypertension. Blood pressure is controlled.  3. Hyperlipidemia. Lipids are viewed. LDL 82, HDL 48. She is on atorvastatin 80  mg.  Disposition: Pending heart catheterization results.   Adely Facer, M.D. 04/04/2013, 8:56 AM     

## 2013-04-04 NOTE — Interval H&P Note (Signed)
History and Physical Interval Note:  04/04/2013 2:12 PM  OPAL DINNING  has presented today for surgery, with the diagnosis of Chest pain  The various methods of treatment have been discussed with the patient and family. After consideration of risks, benefits and other options for treatment, the patient has consented to  Procedure(s): LEFT HEART CATHETERIZATION WITH CORONARY ANGIOGRAM (N/A) as a surgical intervention .  The patient's history has been reviewed, patient examined, no change in status, stable for surgery.  I have reviewed the patient's chart and labs.  Questions were answered to the patient's satisfaction.    Cath Lab Visit (complete for each Cath Lab visit)  Clinical Evaluation Leading to the Procedure:   ACS: yes  Non-ACS:    Anginal Classification: CCS IV  Anti-ischemic medical therapy: Minimal Therapy (1 class of medications)  Non-Invasive Test Results: No non-invasive testing performed  Prior CABG: No previous CABG         Tonny Bollman

## 2013-04-05 NOTE — Discharge Summary (Signed)
I discussed the care plan with Dr Funches and the FPTS team and agree with assessment and plan as documented in the discharge note above.  

## 2013-04-30 ENCOUNTER — Encounter: Payer: Self-pay | Admitting: Emergency Medicine

## 2013-04-30 ENCOUNTER — Emergency Department
Admission: EM | Admit: 2013-04-30 | Discharge: 2013-04-30 | Disposition: A | Discharge status: Home / Self Care | Payer: No Typology Code available for payment source | Source: Home / Self Care | Attending: Family Medicine | Admitting: Family Medicine

## 2013-04-30 DIAGNOSIS — L02619 Cutaneous abscess of unspecified foot: Secondary | ICD-10-CM

## 2013-04-30 DIAGNOSIS — L03032 Cellulitis of left toe: Secondary | ICD-10-CM

## 2013-04-30 MED ORDER — DOXYCYCLINE HYCLATE 100 MG PO CAPS: 100.0000 mg | ORAL_CAPSULE | Freq: Two times a day (BID) | ORAL | Status: DC

## 2013-04-30 NOTE — ED Provider Notes (Signed)
CSN: [id number]     Arrival date & time 04/30/13  1514 History   First MD Initiated Contact with Patient 04/30/13 1530     Chief Complaint  Patient presents with  . Foot Pain      HPI Comments: Three days ago patient's cat scratched and bit her on her left 3rd toe.  She subsequently developed redness, pain, and swelling.  The cat is not ill and is properly immunized.  She visited her PCP who administered an antibiotic injection and started her on Augmentin. The patient complains of persistent pain/swelling in her left 3rd toe, and erythema extending to dorsum of foot.  No fevers, chills, and sweats   Patient is a 52 y.o. female presenting with lower extremity pain. The history is provided by the patient and the spouse.  Foot Pain This is a new problem. Episode onset: 3 days ago. The problem occurs constantly. The problem has been gradually worsening. Associated symptoms comments: none. The symptoms are aggravated by walking. Nothing relieves the symptoms. Treatments tried: Augmentin. The treatment provided no relief.    Past Medical History  Diagnosis Date  . HTN (hypertension)   . HLD (hyperlipidemia)   . CAD (coronary artery disease)     a. Ant STEMI 10/2012 s/p overlapped DES x 2 to prox-mid LAD, with relook cath showing intramural hematoma mid-distal LAD mg'd medically (unclear if initial mechanism of MI was plaque rupture or dissection).   . Ischemic cardiomyopathy     a. Transient - EF 35% at time of STEMI, improved to 55-60% by echo days later.  . Carotid artery disease     a. Dopp 10/2012: R <40% ICA stenosis with mild heterogeneous plaque.   Past Surgical History  Procedure Laterality Date  . Coronary angioplasty with stent placement    . Tonsillectomy     Family History  Problem Relation Age of Onset  . Heart attack Father 22   History  Substance Use Topics  . Smoking status: Never Smoker   . Smokeless tobacco: Never Used  . Alcohol Use: No   OB History   Grav Para  Term Preterm Abortions TAB SAB Ect Mult Living                 Review of Systems  All other systems reviewed and are negative.    Allergies  Lisinopril; Morphine and related; and Penicillins  Home Medications   Current Outpatient Rx  Name  Route  Sig  Dispense  Refill  . amoxicillin-clavulanate (AUGMENTIN) 875-125 MG per tablet   Oral   Take 1 tablet by mouth 2 (two) times daily.         [name] HYDROcodone-acetaminophen (NORCO) 10-325 MG per tablet   Oral   Take 1 tablet by mouth every 6 (six) hours as needed for pain.         [name] aspirin 81 MG tablet   Oral   Take 1 tablet (81 mg total) by mouth daily.         [name] atorvastatin (LIPITOR) 80 MG tablet   Oral   Take 1 tablet (80 mg total) by mouth daily.   30 tablet   6   . carvedilol (COREG) 6.25 MG tablet   Oral   Take 1 tablet (6.25 mg total) by mouth 2 (two) times daily with a meal.   60 tablet   6   . clopidogrel (PLAVIX) 75 MG tablet   Oral   Take 1 tablet (75 mg total) by  mouth daily with breakfast.   30 tablet   6   . desvenlafaxine (PRISTIQ) 100 MG 24 hr tablet   Oral   Take 100 mg by mouth daily.         [name] doxycycline (VIBRAMYCIN) 100 MG capsule   Oral   Take 1 capsule (100 mg total) by mouth 2 (two) times daily.   20 capsule   0   . losartan (COZAAR) 25 MG tablet   Oral   Take 1 tablet (25 mg total) by mouth daily.   30 tablet   6   . nitroGLYCERIN (NITROSTAT) 0.4 MG SL tablet   Sublingual   Place 1 tablet (0.4 mg total) under the tongue every 5 (five) minutes as needed for chest pain (up to 3 doses).   25 tablet   4    BP 139/84  Pulse 84  Temp(Src) 97.8 F (36.6 C) (Oral)  Resp 16  Ht 5\' 6"  (1.676 m)  Wt 203 lb (92.08 kg)  BMI 32.78 kg/m2  SpO2 96%  LMP 04/21/2013 Physical Exam  Nursing note and vitals reviewed. Constitutional: She is oriented to person, place, and time. She appears well-developed and well-nourished. No distress.  Patient is obese (BMI 32.8)  HENT:  Head:  Normocephalic.  Eyes: Conjunctivae are normal. Pupils are equal, round, and reactive to light.  Musculoskeletal: She exhibits edema.       Right foot: She exhibits tenderness and swelling. She exhibits no bony tenderness, normal capillary refill and no laceration.       Feet:  Right 3rd toe is slightly swollen and erythematous, with a 3mm pustule on it medial aspect.  Toe has good range of motion and distal neurovascular function is intact.  Erythema and tenderness extends to dorsum of foot. Specimen taken from pustule for wound culture.  Neurological: She is alert and oriented to person, place, and time.    ED Course  Procedures  none    Labs Reviewed  WOUND CULTURE pending   Narrative:    Performed at:  [facility]                [address]                [place], [place] [address]       MDM   1. Cellulitis of third toe of left foot, secondary to cat scratch/bite    Wound culture taken from pustule on left third toe. Continue Augmentin.  Add doxycycline for improved staph coverage.  Maintain bandage on toe at site of drainage. If symptoms become significantly worse during the night or over the weekend, proceed to the local emergency room.     Lattie Haw, MD 05/02/13 1650

## 2013-04-30 NOTE — ED Notes (Signed)
Patient received scratch from cat 3 days ago on left foot between toes; was seen by PCP; started course of Augmentin yesterday.

## 2013-05-03 ENCOUNTER — Telehealth: Payer: Self-pay | Admitting: *Deleted

## 2013-05-04 ENCOUNTER — Ambulatory Visit: Payer: No Typology Code available for payment source | Admitting: Cardiovascular Disease

## 2013-05-11 ENCOUNTER — Encounter: Payer: Self-pay | Admitting: Cardiology

## 2013-05-25 ENCOUNTER — Other Ambulatory Visit: Payer: Self-pay | Admitting: Physician Assistant

## 2013-06-02 ENCOUNTER — Other Ambulatory Visit: Payer: Self-pay | Admitting: Physician Assistant

## 2013-06-20 ENCOUNTER — Other Ambulatory Visit: Payer: Self-pay | Admitting: Cardiovascular Disease

## 2013-07-20 ENCOUNTER — Ambulatory Visit (INDEPENDENT_AMBULATORY_CARE_PROVIDER_SITE_OTHER): Payer: No Typology Code available for payment source | Admitting: Cardiovascular Disease

## 2013-07-20 ENCOUNTER — Encounter: Payer: Self-pay | Admitting: Cardiovascular Disease

## 2013-07-20 VITALS — BP 124/84 | HR 68 | Ht 65.0 in | Wt 202.0 lb

## 2013-07-20 DIAGNOSIS — I10 Essential (primary) hypertension: Secondary | ICD-10-CM

## 2013-07-20 DIAGNOSIS — E785 Hyperlipidemia, unspecified: Secondary | ICD-10-CM

## 2013-07-20 DIAGNOSIS — I2510 Atherosclerotic heart disease of native coronary artery without angina pectoris: Secondary | ICD-10-CM

## 2013-07-20 MED ORDER — CARVEDILOL 3.125 MG PO TABS: 3.1250 mg | ORAL_TABLET | Freq: Two times a day (BID) | ORAL | Status: DC

## 2013-07-20 NOTE — Progress Notes (Signed)
HPI:  52 year old woman presenting for followup evaluation. She has coronary artery disease and initially presented with an anterior wall MI in February 2014. She was found to have critical stenosis of the mid LAD and was treated with overlapping drug-eluting stents. She had recurrent chest pain in August and was admitted with symptoms worrisome for unstable angina. Repeat cardiac catheterization demonstrated patency of the LAD stents and mild nonobstructive disease elsewhere. Medical therapy was recommended. Most recent lipids from 04/03/2013 showed cholesterol 158, triglycerides 140, HDL 48, and LDL 82.  The patient is doing fairly well. She's had some fleeting chest pains but no exertional chest discomfort or shortness of breath. She denies edema or palpitations. She does complain of generalized fatigue and relates this to her medications.  Outpatient Encounter Prescriptions as of 07/20/2013  Medication Sig  . aspirin 81 MG tablet Take 1 tablet (81 mg total) by mouth daily.  [name] atorvastatin (LIPITOR) 80 MG tablet TAKE ONE TABLET BY MOUTH ONE TIME DAILY   . carvedilol (COREG) 6.25 MG tablet Take 1 tablet by mouth twice daily with meals  . clopidogrel (PLAVIX) 75 MG tablet TAKE ONE TABLET BY MOUTH ONE TIME DAILY WITH BREAKFAST   . desvenlafaxine (PRISTIQ) 100 MG 24 hr tablet Take 100 mg by mouth daily.  [name] HYDROcodone-acetaminophen (NORCO) 10-325 MG per tablet Take 1 tablet by mouth every 6 (six) hours as needed for pain.  [name] losartan (COZAAR) 25 MG tablet TAKE ONE TABLET BY MOUTH ONE TIME DAILY   . nitroGLYCERIN (NITROSTAT) 0.4 MG SL tablet Place 1 tablet (0.4 mg total) under the tongue every 5 (five) minutes as needed for chest pain (up to 3 doses).  . [DISCONTINUED] amoxicillin-clavulanate (AUGMENTIN) 875-125 MG per tablet Take 1 tablet by mouth 2 (two) times daily.  . [DISCONTINUED] doxycycline (VIBRAMYCIN) 100 MG capsule Take 1 capsule (100 mg total) by mouth 2 (two) times daily.     Allergies  Allergen Reactions  . Lisinopril Cough  . Morphine And Related Itching    Itching subsided with benadryl  . Penicillins Rash    Past Medical History  Diagnosis Date  . HTN (hypertension)   . HLD (hyperlipidemia)   . CAD (coronary artery disease)     a. Ant STEMI 10/2012 s/p overlapped DES x 2 to prox-mid LAD, with relook cath showing intramural hematoma mid-distal LAD mg'd medically (unclear if initial mechanism of MI was plaque rupture or dissection).   . Ischemic cardiomyopathy     a. Transient - EF 35% at time of STEMI, improved to 55-60% by echo days later.  . Carotid artery disease     a. Dopp 10/2012: R <40% ICA stenosis with mild heterogeneous plaque.    ROS: Negative except as per HPI  BP 124/84  Pulse 68  Ht 5\' 5"  (1.651 m)  Wt 202 lb (91.627 kg)  BMI 33.61 kg/m2  PHYSICAL EXAM: Pt is alert and oriented, NAD HEENT: normal Neck: JVP - normal, carotids 2+= without bruits Lungs: CTA bilaterally CV: RRR without murmur or gallop Abd: soft, NT, Positive BS, no hepatomegaly Ext: no C/C/E, distal pulses intact and equal Skin: warm/dry no rash  EKG:  Normal sinus rhythm 68 beats per minute, within normal limits.  ASSESSMENT AND PLAN: 1. Coronary artery disease, native vessel. The patient is stable without symptoms of angina. I am going to reduce her carvedilol dose because of generalized fatigue. She also has had mild thyroid testing abnormality with an elevated TSH and a free T4  in the low-normal range. We'll repeat this in a few months and I will see her back in clinical followup in 6 months.  2. Hypertension. Continue carvedilol and losartan. Reduce carvedilol dose to 3.125 mg twice daily.  3. Hyperlipidemia. The patient remains on high-dose atorvastatin. Lipids from 04/03/2013 showed cholesterol of 158, triglycerides 140, HDL 48, and LDL 82.  Tonny Bollman 07/22/2013 10:04 AM

## 2013-07-20 NOTE — Patient Instructions (Signed)
Your physician has recommended you make the following change in your medication: DECREASE Carvedilol to 3.125mg  take one by mouth twice a day  Your physician recommends that you return for a FASTING LIPID, LIVER, BMP, TSH and Free T4 in February--nothing to eat or drink after midnight, lab opens at 7:30 am  Your physician wants you to follow-up in: 6 MONTHS with Dr Excell Seltzer.  You will receive a reminder letter in the mail two months in advance. If you don't receive a letter, please call our office to schedule the follow-up appointment.

## 2013-10-11 ENCOUNTER — Other Ambulatory Visit: Payer: No Typology Code available for payment source

## 2014-01-16 ENCOUNTER — Other Ambulatory Visit: Payer: Self-pay | Admitting: Cardiovascular Disease

## 2014-01-19 ENCOUNTER — Other Ambulatory Visit: Payer: Self-pay | Admitting: Cardiovascular Disease

## 2014-01-23 IMAGING — CR DG CHEST 2V
2 series · 2 of 2 positions shown · non-contrast
Comparison: 10/18/2012

CLINICAL DATA: Chest/back pain

CHEST - 2 VIEW

[w chest pa]
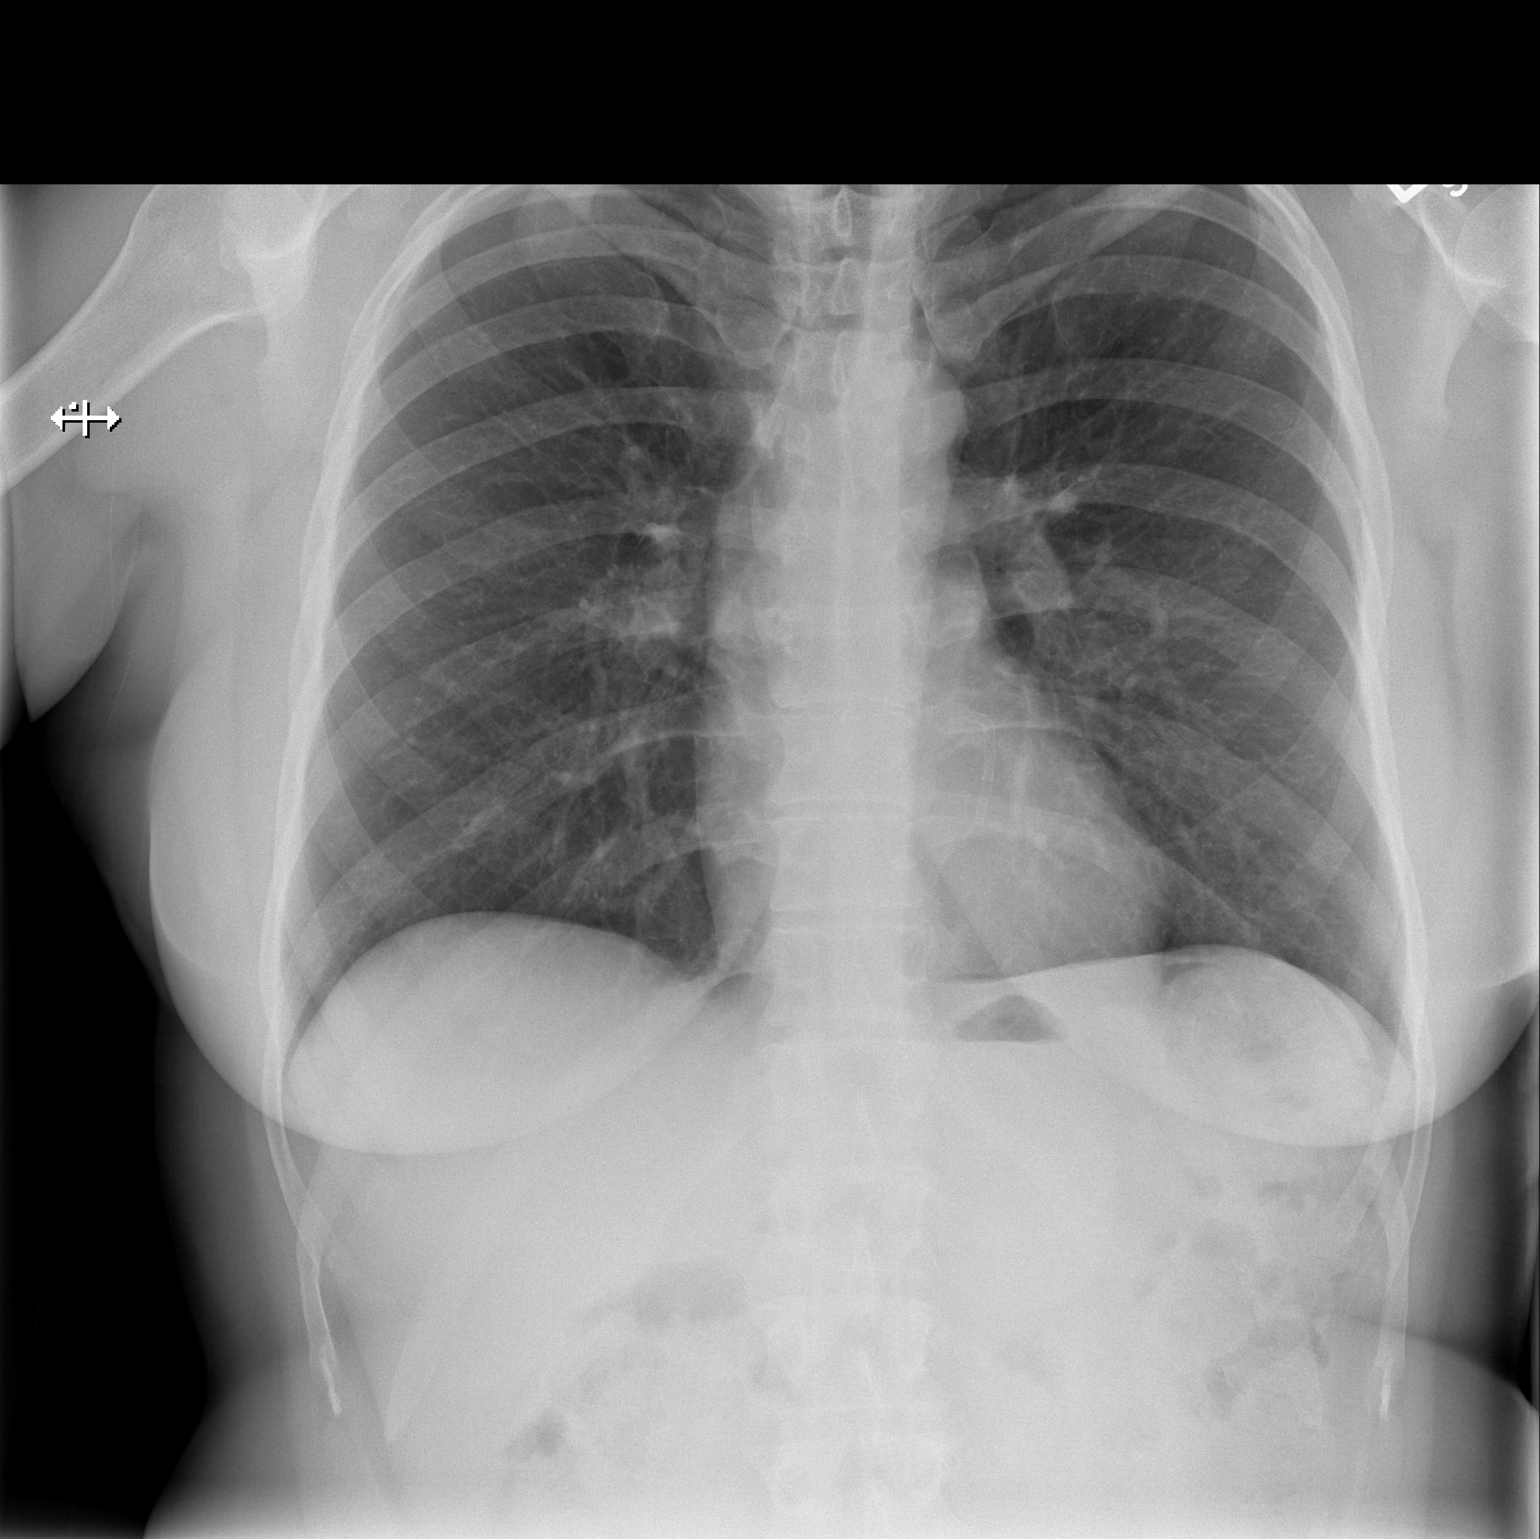

[w chest lat]
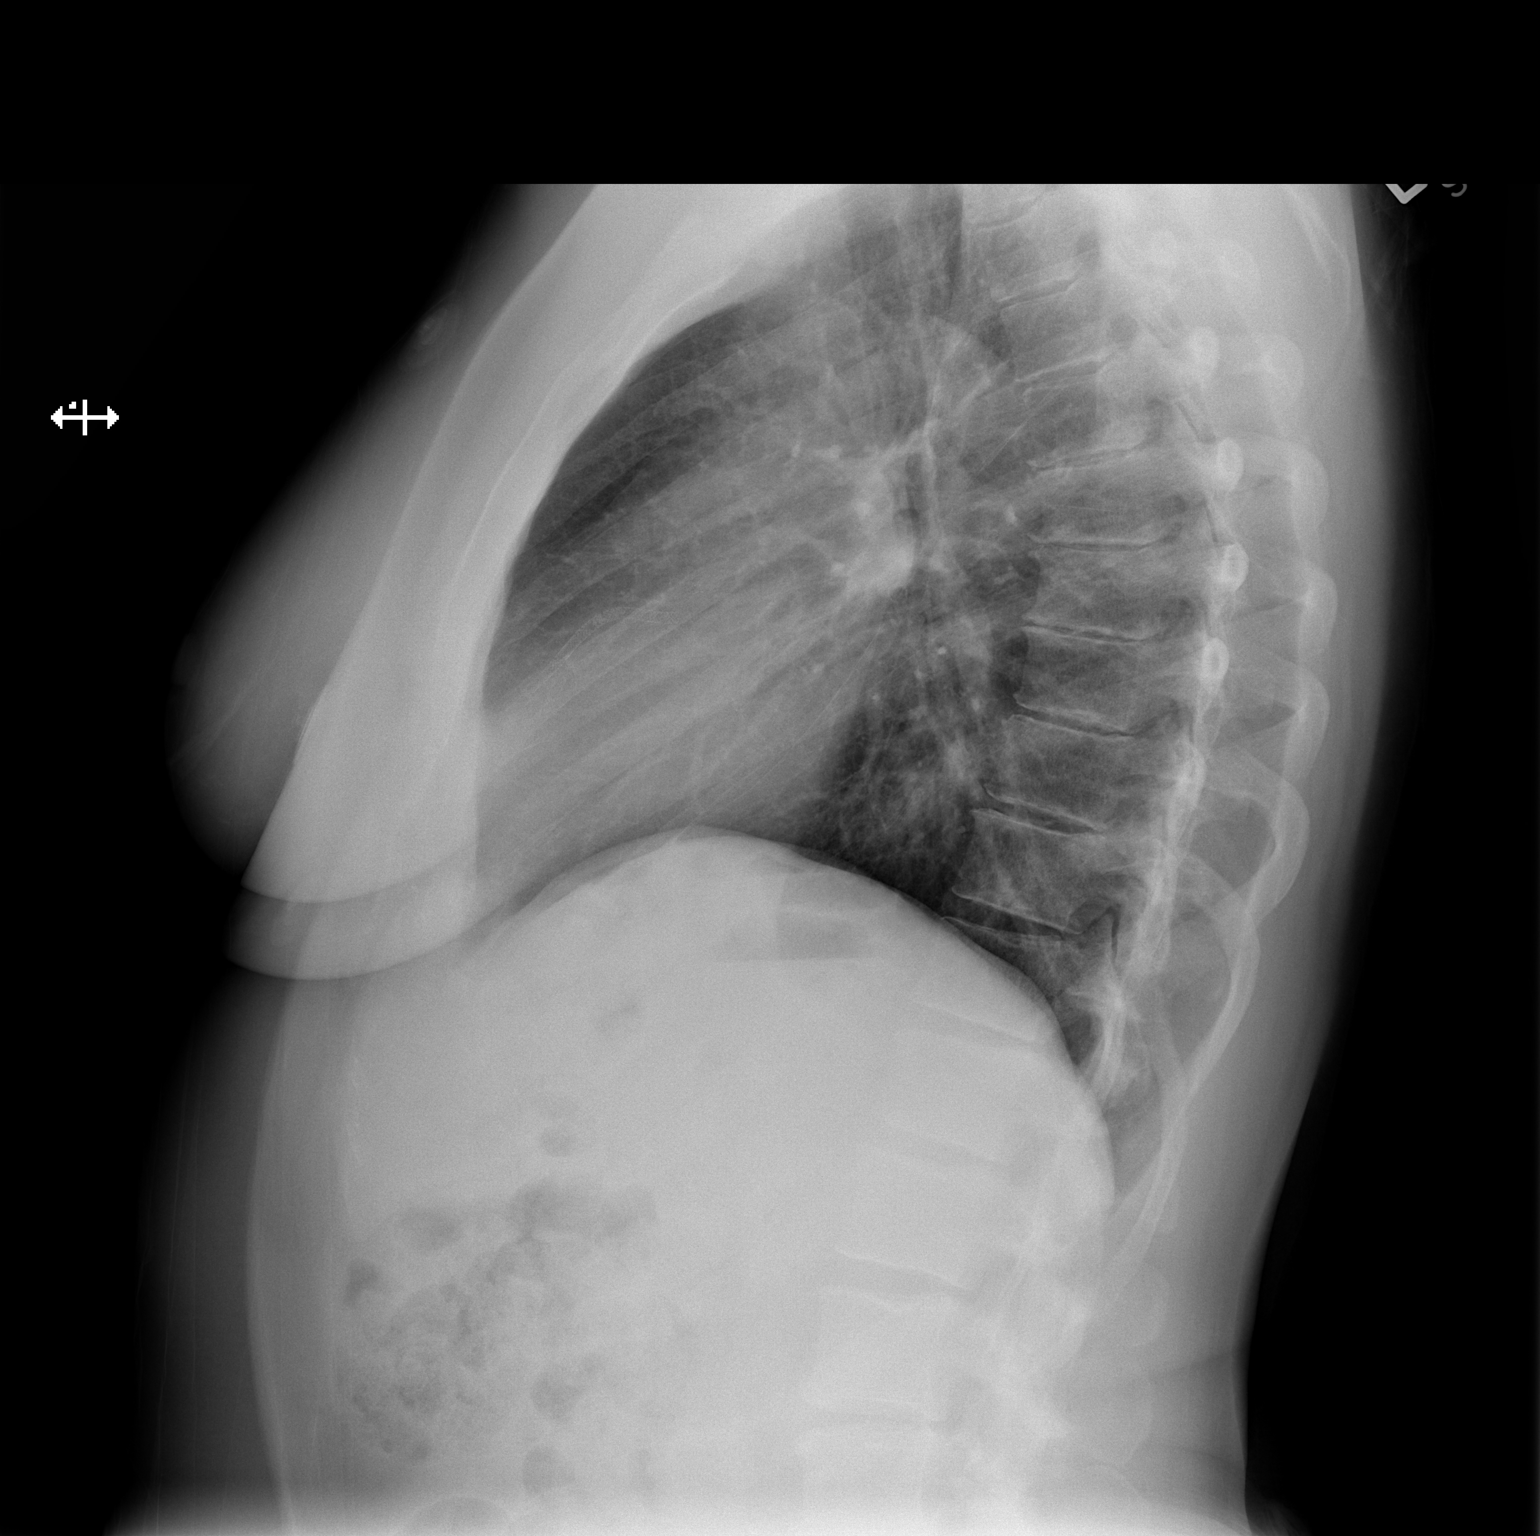

[2 of 2 positions shown; findings below may reference images not displayed]

FINDINGS: Lungs are clear. No pleural effusion or pneumothorax.

Cardiomediastinal silhouette is within normal limits.  Coronary
stent.

Mild degenerative changes of the visualized thoracolumbar spine.
IMPRESSION: No evidence of acute cardiopulmonary disease.

## 2014-01-27 ENCOUNTER — Other Ambulatory Visit: Payer: Self-pay | Admitting: Cardiovascular Disease

## 2014-02-17 ENCOUNTER — Other Ambulatory Visit: Payer: Self-pay | Admitting: Cardiovascular Disease

## 2014-04-17 ENCOUNTER — Encounter: Payer: Self-pay | Admitting: Emergency Medicine

## 2014-04-17 ENCOUNTER — Emergency Department
Admission: EM | Admit: 2014-04-17 | Discharge: 2014-04-17 | Disposition: A | Discharge status: Home / Self Care | Payer: 59 | Source: Home / Self Care | Attending: Family Medicine | Admitting: Family Medicine

## 2014-04-17 DIAGNOSIS — J0101 Acute recurrent maxillary sinusitis: Secondary | ICD-10-CM

## 2014-04-17 DIAGNOSIS — J3089 Other allergic rhinitis: Secondary | ICD-10-CM

## 2014-04-17 DIAGNOSIS — J0100 Acute maxillary sinusitis, unspecified: Secondary | ICD-10-CM

## 2014-04-17 DIAGNOSIS — J309 Allergic rhinitis, unspecified: Secondary | ICD-10-CM

## 2014-04-17 MED ORDER — PREDNISONE 20 MG PO TABS
20.0000 mg | ORAL_TABLET | Freq: Two times a day (BID) | ORAL | Status: DC
Start: 2014-04-17 — End: 2014-05-09

## 2014-04-17 MED ORDER — FLUTICASONE PROPIONATE 50 MCG/ACT NA SUSP
NASAL | Status: DC
Start: 2014-04-17 — End: 2019-09-06

## 2014-04-17 MED ORDER — AMOXICILLIN 875 MG PO TABS: 875.0000 mg | ORAL_TABLET | Freq: Two times a day (BID) | ORAL | Status: DC

## 2014-04-17 NOTE — Discharge Instructions (Signed)
May use Afrin nasal spray (or generic oxymetazoline) twice daily for about 5 days.  Also recommend using saline nasal spray several times daily and saline nasal irrigation (AYR is a common brand).  Use Afrin spray prior to using fluticasone spray.

## 2014-04-17 NOTE — ED Notes (Signed)
Patient c/o of sinus pain with congestion.

## 2014-04-17 NOTE — ED Provider Notes (Signed)
CSN: [id number]     Arrival date & time 04/17/14  [id number] History   First MD Initiated Contact with Patient 04/17/14 [phone]     Chief Complaint  Patient presents with  . Facial Pain      HPI Comments: Patient has a history of chronic sinus disease, having had abnormal sinus X-rays in the past.  She complains of constant bloody nasal discharge.  Over the past 2 weeks she has felt intermittently dizzy and off balance, and her ears frequently "crackle" and pop.  Recently she has had a scratchy throat and increased sneezing.  No cough, and no fevers, chills, and sweats.  The history is provided by the patient.    Past Medical History  Diagnosis Date  . HTN (hypertension)   . HLD (hyperlipidemia)   . CAD (coronary artery disease)     a. Ant STEMI 10/2012 s/p overlapped DES x 2 to prox-mid LAD, with relook cath showing intramural hematoma mid-distal LAD mg'd medically (unclear if initial mechanism of MI was plaque rupture or dissection).   . Ischemic cardiomyopathy     a. Transient - EF 35% at time of STEMI, improved to 55-60% by echo days later.  . Carotid artery disease     a. Dopp 10/2012: R <40% ICA stenosis with mild heterogeneous plaque.   Past Surgical History  Procedure Laterality Date  . Coronary angioplasty with stent placement    . Tonsillectomy     Family History  Problem Relation Age of Onset  . Heart attack Father 6850   History  Substance Use Topics  . Smoking status: Never Smoker   . Smokeless tobacco: Never Used  . Alcohol Use: No   OB History   Grav Para Term Preterm Abortions TAB SAB Ect Mult Living                 Review of Systems + sore throat No cough No pleuritic pain No wheezing + nasal congestion + post-nasal drainage + sinus pain/pressure No itchy/red eyes + bilateral earache No hemoptysis No SOB No fever/chills + nausea No vomiting No abdominal pain No diarrhea No urinary symptoms No skin rash + fatigue No myalgias + headache +  dizziness Used OTC meds without relief  Allergies  Lisinopril; Morphine and related; and Penicillins  Home Medications   Prior to Admission medications   Medication Sig Start Date End Date Taking? Authorizing Provider  amoxicillin (AMOXIL) 875 MG tablet Take 1 tablet (875 mg total) by mouth 2 (two) times daily. 04/17/14   Lattie HawStephen A Beese, MD  aspirin 81 MG tablet Take 1 tablet (81 mg total) by mouth daily. 10/22/12   Dayna N Dunn, PA-C  atorvastatin (LIPITOR) 80 MG tablet TAKE ONE TABLET BY MOUTH ONE TIME DAILY  01/27/14   Tonny BollmanMichael Cooper, MD  carvedilol (COREG) 3.125 MG tablet Take 1 tablet (3.125 mg total) by mouth 2 (two) times daily with a meal. 07/20/13   Tonny BollmanMichael Cooper, MD  clopidogrel (PLAVIX) 75 MG tablet TAKE ONE TABLET BY MOUTH ONE TIME DAILY  02/17/14   Tonny BollmanMichael Cooper, MD  desvenlafaxine (PRISTIQ) 100 MG 24 hr tablet Take 100 mg by mouth daily.    Historical Provider, MD  fluticasone Aleda Grana(FLONASE) 50 MCG/ACT nasal spray Place 2 sprays in each nostril once daily for allergy symptoms 04/17/14   Lattie HawStephen A Beese, MD  HYDROcodone-acetaminophen (NORCO) 10-325 MG per tablet Take 1 tablet by mouth every 6 (six) hours as needed for pain.    Historical Provider, MD  losartan (COZAAR) 25 MG tablet TAKE ONE TABLET BY MOUTH ONE TIME DAILY  02/17/14   Tonny Bollman, MD  nitroGLYCERIN (NITROSTAT) 0.4 MG SL tablet Place 1 tablet (0.4 mg total) under the tongue every 5 (five) minutes as needed for chest pain (up to 3 doses). 10/22/12   Dayna N Dunn, PA-C  predniSONE (DELTASONE) 20 MG tablet Take 1 tablet (20 mg total) by mouth 2 (two) times daily. Take with food. 04/17/14   Lattie Haw, MD   BP 150/92  Pulse 72  Temp(Src) 97.8 F (36.6 C) (Oral)  Resp 16  Ht 5\' 6"  (1.676 m)  Wt 210 lb (95.255 kg)  BMI 33.91 kg/m2  SpO2 9% Physical Exam Nursing notes and Vital Signs reviewed. Appearance:  Patient appears stated age, and in no acute distress.  Patient is obese (BMI 33.9) Eyes:  Pupils are equal,  round, and reactive to light and accomodation.  Extraocular movement is intact.  Conjunctivae are not inflamed  Ears:  Canals normal.  Tympanic membranes normal.  Nose:  Congested turbinates.  Maxillary sinus tenderness is present.  Pharynx:  Normal Neck:  Supple.  No adenopathy Lungs:  Clear to auscultation.  Breath sounds are equal.  Heart:  Regular rate and rhythm without murmurs, rubs, or gallops.  Abdomen:  Nontender without masses or hepatosplenomegaly.  Bowel sounds are present.  No CVA or flank tenderness.  Extremities:  No edema.  No calf tenderness Skin:  No rash present.   ED Course  Procedures  None  Labs Reviewed -  Tympanogram:  Normal both ears      MDM   1. Perennial allergic rhinitis   2. Acute recurrent maxillary sinusitis    Begin prednisone burst and amoxicillin (has had no adverse effects from amoxicillin) Begin fluticasone nasal spray. May use Afrin nasal spray (or generic oxymetazoline) twice daily for about 5 days.  Also recommend using saline nasal spray several times daily and saline nasal irrigation (AYR is a common brand).  Use Afrin spray prior to using fluticasone spray. Followup with ENT if not improved 2 weeks.    Lattie Haw, MD 04/22/14 1100

## 2014-05-09 ENCOUNTER — Encounter: Payer: Self-pay | Admitting: Cardiovascular Disease

## 2014-05-09 ENCOUNTER — Ambulatory Visit (INDEPENDENT_AMBULATORY_CARE_PROVIDER_SITE_OTHER): Payer: 59 | Admitting: Cardiovascular Disease

## 2014-05-09 VITALS — BP 122/86 | HR 70 | Ht 66.0 in | Wt 209.1 lb

## 2014-05-09 DIAGNOSIS — E785 Hyperlipidemia, unspecified: Secondary | ICD-10-CM

## 2014-05-09 DIAGNOSIS — I2510 Atherosclerotic heart disease of native coronary artery without angina pectoris: Secondary | ICD-10-CM

## 2014-05-09 MED ORDER — CARVEDILOL 3.125 MG PO TABS: 3.1250 mg | ORAL_TABLET | Freq: Every day | ORAL | Status: DC

## 2014-05-09 NOTE — Patient Instructions (Signed)
Your physician has recommended you make the following change in your medication:  1) REDUCE Carvedilol to 31.25mg  daily for 1 week then stop   Your physician recommends that you return for a FASTING lipid profile,cmet,cbc,tsh,free t4: 05/12/14 between 7:30am-5:15pm   Your physician wants you to follow-up in: 1 year with Dr.Cooper You will receive a reminder letter in the mail two months in advance. If you don't receive a letter, please call our office to schedule the follow-up appointment.

## 2014-05-09 NOTE — Progress Notes (Signed)
HPI:  53 year old woman presenting for followup evaluation. She has coronary artery disease and initially presented with an anterior wall MI in February 2014. She was found to have critical stenosis of the mid LAD and was treated with overlapping drug-eluting stents. She had recurrent chest pain in August 2014 and was admitted with symptoms worrisome for unstable angina. Repeat cardiac catheterization demonstrated patency of the LAD stents and mild nonobstructive disease elsewhere. Medical therapy was recommended.  The patient is doing okay. She continues to complain of fairly marked fatigue. She also has dizziness that occurs with positional changes. She describes both a lightheadedness and a feeling of the room spinning. She reports no bleeding problems. She's had no chest pain or pressure. She denies dyspnea, edema, or heart palpitations.   Outpatient Encounter Prescriptions as of 05/09/2014  Medication Sig  . aspirin 81 MG tablet Take 1 tablet (81 mg total) by mouth daily.  [name] atorvastatin (LIPITOR) 80 MG tablet TAKE ONE TABLET BY MOUTH ONE TIME DAILY   . carvedilol (COREG) 3.125 MG tablet Take 1 tablet (3.125 mg total) by mouth 2 (two) times daily with a meal.  . clopidogrel (PLAVIX) 75 MG tablet TAKE ONE TABLET BY MOUTH ONE TIME DAILY   . desvenlafaxine (PRISTIQ) 100 MG 24 hr tablet Take 100 mg by mouth daily.  . fluticasone (FLONASE) 50 MCG/ACT nasal spray Place 2 sprays in each nostril once daily for allergy symptoms  . HYDROcodone-acetaminophen (NORCO) 10-325 MG per tablet Take 1 tablet by mouth every 6 (six) hours as needed for pain.  [name] losartan (COZAAR) 25 MG tablet TAKE ONE TABLET BY MOUTH ONE TIME DAILY   . nitroGLYCERIN (NITROSTAT) 0.4 MG SL tablet Place 1 tablet (0.4 mg total) under the tongue every 5 (five) minutes as needed for chest pain (up to 3 doses).  . predniSONE (DELTASONE) 20 MG tablet Take 1 tablet (20 mg total) by mouth 2 (two) times daily. Take with food.  .  [DISCONTINUED] amoxicillin (AMOXIL) 875 MG tablet Take 1 tablet (875 mg total) by mouth 2 (two) times daily.    Allergies  Allergen Reactions  . Lisinopril Cough  . Morphine And Related Itching    Itching subsided with benadryl  . Penicillins Rash    Past Medical History  Diagnosis Date  . HTN (hypertension)   . HLD (hyperlipidemia)   . CAD (coronary artery disease)     a. Ant STEMI 10/2012 s/p overlapped DES x 2 to prox-mid LAD, with relook cath showing intramural hematoma mid-distal LAD mg'd medically (unclear if initial mechanism of MI was plaque rupture or dissection).   . Ischemic cardiomyopathy     a. Transient - EF 35% at time of STEMI, improved to 55-60% by echo days later.  . Carotid artery disease     a. Dopp 10/2012: R <40% ICA stenosis with mild heterogeneous plaque.    ROS: Negative except as per HPI  Ht  (1.676 m)  PHYSICAL EXAM: Pt is alert and oriented, NAD HEENT: normal Neck: JVP - normal, carotids 2+= without bruits Lungs: CTA bilaterally CV: RRR without murmur or gallop Abd: soft, NT, Positive BS, no hepatomegaly Ext: no C/C/E, distal pulses intact and equal Skin: warm/dry no rash  EKG:  Normal sinus rhythm with sinus arrhythmia, heart rate 70 beats per minute, nonspecific T wave abnormality.  Cardiac catheterization 04/04/2013: Procedural Findings:  Hemodynamics:  AO 127/77  LV 126/16  Coronary angiography:  Coronary dominance: right  Left mainstem: Arises from left  cusp. Patent without significant disease.  Left anterior descending (LAD): patent throughout. Large vessel reaches LV apex. Overlapping stents in mid-vessel patent without restenosis.  Left circumflex (LCx): Large vessel. Minor plaque in mid-vessel. OM1 is a large vessel without stenosis  Right coronary artery (RCA): Large, dominant vessel. Widely patent without obstructive disease. PDA patent without disease.  Left ventriculography: Left ventricular systolic function is normal,  LVEF is estimated at 55-65%, there is no significant mitral regurgitation  Final Conclusions:  1. Widely patent stents in LAD  2. No obstructive CAD  3. Normal LV function  Recommendations: continue medical therapy. D/c home this afternoon.   ASSESSMENT AND PLAN: 1. Coronary artery disease, native vessel. The patient is stable without symptoms of angina. We discussed consideration of long-term dual antiplatelet therapy versus discontinuation after 12 months. After discussion of pros and cons, will continue aspirin and Plavix for another 12 months.  2. Hypertension. Blood pressure is well controlled on low-dose carvedilol and losartan. With profound fatigue, will wean her off of carvedilol. I asked her to reduce her dose to 3.125 mg daily x1 week, then stop. She will report symptoms in about one month. Ideally she would remain on a long-term beta blocker depending on how she responds symptomatically to stopping this.  3. Hyperlipidemia. The patient remains on high-dose atorvastatin. Last lipids from one year ago showed a cholesterol 158, triglycerides 140, HDL 48, and LDL 82. Due for repeat lipids and LFTs. Will also draw thyroid studies as these were mildly abnormal in the past.  Tonny Bollman MD 05/09/2014 2:12 PM

## 2014-05-12 ENCOUNTER — Other Ambulatory Visit (INDEPENDENT_AMBULATORY_CARE_PROVIDER_SITE_OTHER): Payer: 59

## 2014-05-12 DIAGNOSIS — E785 Hyperlipidemia, unspecified: Secondary | ICD-10-CM

## 2014-05-12 DIAGNOSIS — I2510 Atherosclerotic heart disease of native coronary artery without angina pectoris: Secondary | ICD-10-CM

## 2014-05-12 LAB — LIPID PANEL
Cholesterol: 159 mg/dL (ref 0–200)
HDL: 46.3 mg/dL (ref >39.00)
LDL CALC: 99 mg/dL (ref 0–99)
NONHDL: 112.7
Total CHOL/HDL Ratio: 3
Triglycerides: 68 mg/dL (ref 0.0–149.0)
VLDL: 13.6 mg/dL (ref 0.0–40.0)

## 2014-05-12 LAB — COMPREHENSIVE METABOLIC PANEL
ALK PHOS: 100 U/L (ref 39–117)
ALT: 27 U/L (ref 0–35)
AST: 22 U/L (ref 0–37)
Albumin: 3.6 g/dL (ref 3.5–5.2)
BILIRUBIN TOTAL: 0.6 mg/dL (ref 0.2–1.2)
BUN: 8 mg/dL (ref 6–23)
CO2: 28 meq/L (ref 19–32)
CREATININE: 0.8 mg/dL (ref 0.4–1.2)
Calcium: 8.6 mg/dL (ref 8.4–10.5)
Chloride: 105 mEq/L (ref 96–112)
GFR: 80.05 mL/min (ref >60.00)
Glucose, Bld: 86 mg/dL (ref 70–99)
Potassium: 3.6 mEq/L (ref 3.5–5.1)
SODIUM: 139 meq/L (ref 135–145)
Total Protein: 6.7 g/dL (ref 6.0–8.3)

## 2014-05-12 LAB — CBC WITH DIFFERENTIAL/PLATELET
BASOS PCT: 0.3 % (ref 0.0–3.0)
Basophils Absolute: 0 10*3/uL (ref 0.0–0.1)
EOS ABS: 0 10*3/uL (ref 0.0–0.7)
Eosinophils Relative: 1 % (ref 0.0–5.0)
HCT: 40.7 % (ref 36.0–46.0)
HEMOGLOBIN: 13.4 g/dL (ref 12.0–15.0)
Lymphocytes Relative: 33.6 % (ref 12.0–46.0)
Lymphs Abs: 1.7 10*3/uL (ref 0.7–4.0)
MCHC: 32.9 g/dL (ref 30.0–36.0)
MCV: 91.9 fl (ref 78.0–100.0)
MONO ABS: 0.4 10*3/uL (ref 0.1–1.0)
Monocytes Relative: 8.4 % (ref 3.0–12.0)
NEUTROS ABS: 2.9 10*3/uL (ref 1.4–7.7)
NEUTROS PCT: 56.7 % (ref 43.0–77.0)
Platelets: 233 10*3/uL (ref 150.0–400.0)
RBC: 4.43 Mil/uL (ref 3.87–5.11)
RDW: 15.1 % (ref 11.5–15.5)
WBC: 5.1 10*3/uL (ref 4.0–10.5)

## 2014-05-12 LAB — TSH: TSH: 1.7 u[IU]/mL (ref 0.35–4.50)

## 2014-05-12 LAB — T4, FREE: Free T4: 0.78 ng/dL (ref 0.60–1.60)

## 2014-05-16 ENCOUNTER — Other Ambulatory Visit: Payer: Self-pay

## 2014-05-16 MED ORDER — CLOPIDOGREL BISULFATE 75 MG PO TABS: ORAL_TABLET | ORAL | Status: DC

## 2014-05-16 MED ORDER — LOSARTAN POTASSIUM 25 MG PO TABS: ORAL_TABLET | ORAL | Status: DC

## 2014-05-17 ENCOUNTER — Other Ambulatory Visit: Payer: Self-pay

## 2014-05-17 MED ORDER — ATORVASTATIN CALCIUM 80 MG PO TABS: ORAL_TABLET | ORAL | Status: DC

## 2014-08-10 ENCOUNTER — Encounter (HOSPITAL_COMMUNITY): Payer: Self-pay | Admitting: Cardiovascular Disease

## 2014-08-10 ENCOUNTER — Other Ambulatory Visit: Payer: Self-pay | Admitting: Cardiovascular Disease

## 2014-08-17 ENCOUNTER — Other Ambulatory Visit: Payer: Self-pay | Admitting: *Deleted

## 2014-08-17 MED ORDER — NITROGLYCERIN 0.4 MG SL SUBL: 0.4000 mg | SUBLINGUAL_TABLET | SUBLINGUAL | Status: DC | PRN

## 2014-08-20 ENCOUNTER — Other Ambulatory Visit: Payer: Self-pay | Admitting: Cardiovascular Disease

## 2014-10-14 ENCOUNTER — Other Ambulatory Visit: Payer: Self-pay | Admitting: Cardiovascular Disease

## 2014-10-16 NOTE — Telephone Encounter (Signed)
NEEDS APPT WITH DR. Excell SeltzerOOPER FOR MORE REFILS

## 2014-11-06 ENCOUNTER — Other Ambulatory Visit: Payer: Self-pay

## 2014-11-06 MED ORDER — ATORVASTATIN CALCIUM 80 MG PO TABS: ORAL_TABLET | ORAL | Status: DC

## 2014-11-06 NOTE — Telephone Encounter (Signed)
Tonny BollmanMichael Cooper, MD at 05/09/2014 2:12 PM  atorvastatin (LIPITOR) 80 MG tablet TAKE ONE TABLET BY MOUTH ONE TIME DAILY

## 2014-12-22 ENCOUNTER — Other Ambulatory Visit: Payer: Self-pay

## 2014-12-22 MED ORDER — LOSARTAN POTASSIUM 25 MG PO TABS: 25.0000 mg | ORAL_TABLET | Freq: Every day | ORAL | Status: DC

## 2015-05-08 ENCOUNTER — Other Ambulatory Visit: Payer: Self-pay | Admitting: Cardiovascular Disease

## 2015-06-20 ENCOUNTER — Other Ambulatory Visit: Payer: Self-pay | Admitting: Cardiovascular Disease

## 2015-07-30 ENCOUNTER — Ambulatory Visit (INDEPENDENT_AMBULATORY_CARE_PROVIDER_SITE_OTHER): Payer: 59 | Admitting: Cardiovascular Disease

## 2015-07-30 ENCOUNTER — Encounter: Payer: Self-pay | Admitting: Cardiovascular Disease

## 2015-07-30 VITALS — BP 146/94 | HR 76 | Ht 66.0 in | Wt 213.4 lb

## 2015-07-30 DIAGNOSIS — I2510 Atherosclerotic heart disease of native coronary artery without angina pectoris: Secondary | ICD-10-CM | POA: Diagnosis not present

## 2015-07-30 DIAGNOSIS — I10 Essential (primary) hypertension: Secondary | ICD-10-CM | POA: Diagnosis not present

## 2015-07-30 DIAGNOSIS — E785 Hyperlipidemia, unspecified: Secondary | ICD-10-CM

## 2015-07-30 MED ORDER — LOSARTAN POTASSIUM 50 MG PO TABS: 50.0000 mg | ORAL_TABLET | Freq: Every day | ORAL | Status: DC

## 2015-07-30 NOTE — Patient Instructions (Signed)
Medication Instructions:  Your physician has recommended you make the following change in your medication:  1. INCREASE Losartan to 50mg  take one tablet by mouth daily  Labwork: Your physician recommends that you return for a FASTING LIPID, LIVER, BMP and CBC--nothing to eat or drink after midnight, lab opens at 7:30 AM  Testing/Procedures: No new orders.  Follow-Up: Your physician wants you to follow-up in: 1 YEAR with Dr Excell Seltzerooper.  You will receive a reminder letter in the mail two months in advance. If you don't receive a letter, please call our office to schedule the follow-up appointment.   Any Other Special Instructions Will Be Listed Below (If Applicable).     If you need a refill on your cardiac medications before your next appointment, please call your pharmacy.

## 2015-07-30 NOTE — Progress Notes (Signed)
Cardiology Office Note Date:  07/30/2015   ID:  Deanna Hogan, DOB 09/07/1960, MRN [id number]  PCP:  Dalbert MayotteSNIDER, ALISON, MD  Cardiologist:  Tonny Bollmanooper, Adah Stoneberg, MD    Chief Complaint  Patient presents with  . Coronary Artery Disease    History of Present Illness: Deanna Hogan is a 54 y.o. female who presents for followup evaluation. She has coronary artery disease and initially presented with an anterior wall MI in February 2014. She was found to have critical stenosis of the mid LAD and was treated with overlapping drug-eluting stents. She had recurrent chest pain in August 2014 and was admitted with symptoms worrisome for unstable angina. Repeat cardiac catheterization demonstrated patency of the LAD stents and mild nonobstructive disease elsewhere. Medical therapy was recommended.   the patient is doing well. She denies any recurrence of cardiac symptoms.Today, she denies symptoms of palpitations, chest pain, shortness of breath, orthopnea, PND, lower extremity edema, dizziness, or syncope. Since her last visit, there are no changes in her cardiac medications. Notes BP is elevated today but thinks it's from lack of sleep/stress over past few days.   Past Medical History  Diagnosis Date  . HTN (hypertension)   . HLD (hyperlipidemia)   . CAD (coronary artery disease)     a. Ant STEMI 10/2012 s/p overlapped DES x 2 to prox-mid LAD, with relook cath showing intramural hematoma mid-distal LAD mg'd medically (unclear if initial mechanism of MI was plaque rupture or dissection).   . Ischemic cardiomyopathy     a. Transient - EF 35% at time of STEMI, improved to 55-60% by echo days later.  . Carotid artery disease (HCC)     a. Dopp 10/2012: R <40% ICA stenosis with mild heterogeneous plaque.    Past Surgical History  Procedure Laterality Date  . Coronary angioplasty with stent placement    . Tonsillectomy    . Left heart catheterization with coronary angiogram N/A 10/18/2012    Procedure:  LEFT HEART CATHETERIZATION WITH CORONARY ANGIOGRAM;  Surgeon: Tonny BollmanMichael Myalynn Lingle, MD;  [facility];  Service: Cardiovascular;  Laterality: N/A;  . Percutaneous coronary stent intervention (pci-s)  10/18/2012    Procedure: PERCUTANEOUS CORONARY STENT INTERVENTION (PCI-S);  Surgeon: Tonny BollmanMichael Rane Dumm, MD;  [facility];  Service: Cardiovascular;;  . Left heart catheterization with coronary angiogram N/A 10/19/2012    Procedure: LEFT HEART CATHETERIZATION WITH CORONARY ANGIOGRAM;  Surgeon: Peter M SwazilandJordan, MD;  [facility];  Service: Cardiovascular;  Laterality: N/A;  . Left heart catheterization with coronary angiogram N/A 04/04/2013    Procedure: LEFT HEART CATHETERIZATION WITH CORONARY ANGIOGRAM;  Surgeon: Tonny BollmanMichael Amayiah Gosnell, MD;  [facility];  Service: Cardiovascular;  Laterality: N/A;    Current Outpatient Prescriptions  Medication Sig Dispense Refill  . aspirin 81 MG tablet Take 1 tablet (81 mg total) by mouth daily.    [name]. atorvastatin (LIPITOR) 80 MG tablet TAKE ONE TABLET BY MOUTH ONE TIME DAILY 30 tablet 2  . carvedilol (COREG) 3.125 MG tablet Take 1 tablet (3.125 mg total) by mouth daily.    . clopidogrel (PLAVIX) 75 MG tablet TAKE 1 TABLET BY MOUTH EVERY DAY 30 tablet 1  . desvenlafaxine (PRISTIQ) 100 MG 24 hr tablet Take 100 mg by mouth daily.    . fexofenadine (ALLEGRA) 180 MG tablet Take 180 mg by mouth daily.    . fluticasone (FLONASE) 50 MCG/ACT nasal spray Place 2 sprays in each nostril once daily for allergy symptoms 16 g 1  .  losartan (COZAAR) 50 MG tablet Take 1 tablet (50 mg total) by mouth daily. 30 tablet 11  . nitroGLYCERIN (NITROSTAT) 0.4 MG SL tablet Place 1 tablet (0.4 mg total) under the tongue every 5 (five) minutes as needed for chest pain (up to 3 doses). 25 tablet 5  . venlafaxine XR (EFFEXOR XR) 75 MG 24 hr capsule Take 75 mg by mouth daily.     No current facility-administered medications for this visit.    Allergies:   Lisinopril;  Morphine and related; and Penicillins   Social History:  The patient  reports that she has never smoked. She has never used smokeless tobacco. She reports that she does not drink alcohol or use illicit drugs.   Family History:  The patient's  family history includes ALS in her father; Healthy in her sister; Heart attack (age of onset: 39) in her father; Heart disease in her father and mother; Hypertension in her mother.    ROS:  Please see the history of present illness. All other systems are reviewed and negative.   PHYSICAL EXAM: VS:  BP 146/94 mmHg  Pulse 76  Ht  (1.676 m)  Wt 213 lb 6.4 oz (96.798 kg)  BMI 34.46 kg/m2 , BMI Body mass index is 34.46 kg/(m^2). GEN: Well nourished, well developed, in no acute distress HEENT: normal Neck: no JVD, no masses. No carotid bruits Cardiac: RRR without murmur or gallop                Respiratory:  clear to auscultation bilaterally, normal work of breathing GI: soft, nontender, nondistended, + BS MS: no deformity or atrophy Ext: no pretibial edema, pedal pulses 2+= bilaterally Skin: warm and dry, no rash Neuro:  Strength and sensation are intact Psych: euthymic mood, full affect  EKG:  EKG is ordered today. The ekg ordered today shows  Normal sinus rhythm, nonspecific T wave abnormality.  Recent Labs: No results found for requested labs within last 365 days.   Studies reviewed: 2D Echo 12/24/2012: Study Conclusions  - Left ventricle: The cavity size was normal. Wall thickness was normal. Systolic function was normal. The estimated ejection fraction was in the range of 50% to 55%. Wall motion was normal; there were no regional wall motion abnormalities. Left ventricular diastolic function parameters were normal. - Mitral valve: Mild regurgitation.  Lipid Panel     Component Value Date/Time   CHOL 159 05/12/2014 1142   TRIG 68.0 05/12/2014 1142   HDL 46.30 05/12/2014 1142   CHOLHDL 3 05/12/2014 1142   VLDL 13.6  05/12/2014 1142   LDLCALC 99 05/12/2014 1142   Wt Readings from Last 3 Encounters:  07/30/15 213 lb 6.4 oz (96.798 kg)  05/09/14 209 lb 1.9 oz (94.856 kg)  04/17/14 210 lb (95.255 kg)    ASSESSMENT AND PLAN: 1.  CAD, native vessel:  The patient is stable without symptoms of angina. She continues on dual antiplatelet therapy with aspirin and Plavix. She's had no bleeding problems. I will see her back in one year.   2. Essential HTN: BP control sub-optimal. Recommend increase losartan to 50 mg daily. Continue carvedilol at low-dose.  3. Hyperlipidemia: continues on atorvastatin 80 mg. Will update lab work as it's been greater than 12 months since her last labs.  Current medicines are reviewed with the patient today.  The patient does not have concerns regarding medicines.  Labs/ tests ordered today include:   Orders Placed This Encounter  Procedures  . Lipid panel  .  Hepatic function panel  . Basic Metabolic Panel (BMET)  . CBC  . EKG 12-Lead   Disposition:   FU one year  Signed, Tonny Bollman, MD  07/30/2015 5:13 PM    [facility] [address] [place], [place], [place]  [address] Phone: [phone]; Fax: [phone]

## 2015-08-01 ENCOUNTER — Other Ambulatory Visit (INDEPENDENT_AMBULATORY_CARE_PROVIDER_SITE_OTHER): Payer: 59

## 2015-08-01 DIAGNOSIS — I2510 Atherosclerotic heart disease of native coronary artery without angina pectoris: Secondary | ICD-10-CM

## 2015-08-07 ENCOUNTER — Other Ambulatory Visit: Payer: Self-pay | Admitting: Cardiovascular Disease

## 2015-08-21 ENCOUNTER — Other Ambulatory Visit: Payer: Self-pay | Admitting: Cardiovascular Disease

## 2016-07-29 ENCOUNTER — Other Ambulatory Visit: Payer: Self-pay | Admitting: Cardiovascular Disease

## 2016-08-04 ENCOUNTER — Other Ambulatory Visit: Payer: Self-pay | Admitting: Cardiovascular Disease

## 2016-08-04 DIAGNOSIS — E785 Hyperlipidemia, unspecified: Secondary | ICD-10-CM

## 2016-08-04 DIAGNOSIS — I2510 Atherosclerotic heart disease of native coronary artery without angina pectoris: Secondary | ICD-10-CM

## 2016-08-04 DIAGNOSIS — I10 Essential (primary) hypertension: Secondary | ICD-10-CM

## 2016-08-13 ENCOUNTER — Other Ambulatory Visit: Payer: Self-pay | Admitting: Cardiovascular Disease

## 2016-08-29 ENCOUNTER — Other Ambulatory Visit: Payer: Self-pay | Admitting: Cardiovascular Disease

## 2016-09-05 ENCOUNTER — Other Ambulatory Visit: Payer: Self-pay | Admitting: Cardiovascular Disease

## 2016-09-05 DIAGNOSIS — I2510 Atherosclerotic heart disease of native coronary artery without angina pectoris: Secondary | ICD-10-CM

## 2016-09-05 DIAGNOSIS — E785 Hyperlipidemia, unspecified: Secondary | ICD-10-CM

## 2016-09-05 DIAGNOSIS — I10 Essential (primary) hypertension: Secondary | ICD-10-CM

## 2016-09-20 ENCOUNTER — Other Ambulatory Visit: Payer: Self-pay | Admitting: Cardiovascular Disease

## 2016-09-20 DIAGNOSIS — E785 Hyperlipidemia, unspecified: Secondary | ICD-10-CM

## 2016-09-20 DIAGNOSIS — I10 Essential (primary) hypertension: Secondary | ICD-10-CM

## 2016-09-20 DIAGNOSIS — I2510 Atherosclerotic heart disease of native coronary artery without angina pectoris: Secondary | ICD-10-CM

## 2016-09-23 ENCOUNTER — Other Ambulatory Visit: Payer: Self-pay | Admitting: Cardiovascular Disease

## 2016-09-30 ENCOUNTER — Other Ambulatory Visit: Payer: Self-pay | Admitting: Cardiovascular Disease

## 2016-10-05 ENCOUNTER — Other Ambulatory Visit: Payer: Self-pay | Admitting: Cardiovascular Disease

## 2016-10-05 DIAGNOSIS — E785 Hyperlipidemia, unspecified: Secondary | ICD-10-CM

## 2016-10-05 DIAGNOSIS — I10 Essential (primary) hypertension: Secondary | ICD-10-CM

## 2016-10-05 DIAGNOSIS — I2510 Atherosclerotic heart disease of native coronary artery without angina pectoris: Secondary | ICD-10-CM

## 2016-10-07 NOTE — Telephone Encounter (Signed)
Please advise on refill request as patient has not been seen since 07/30/15 and has been given several refills for a quantity of fifteen with a note to call and schedule. Patient has failed to do so. Thanks, MI

## 2016-10-07 NOTE — Telephone Encounter (Signed)
I attempted to reach the pt to arrange follow-up appointment.  The mobile number is no longer a working number and the home number is just a fast busy signal.  Please refuse Rx at this time. The pt either can have this filled by PCP or arrange appointment with Dr Excell Seltzerooper.

## 2016-10-08 ENCOUNTER — Other Ambulatory Visit: Payer: Self-pay | Admitting: Cardiovascular Disease

## 2016-10-14 ENCOUNTER — Other Ambulatory Visit: Payer: Self-pay | Admitting: *Deleted

## 2016-10-14 MED ORDER — ATORVASTATIN CALCIUM 80 MG PO TABS: 80.0000 mg | ORAL_TABLET | Freq: Every day | ORAL | 0 refills | Status: DC

## 2016-10-17 ENCOUNTER — Other Ambulatory Visit: Payer: Self-pay | Admitting: Cardiovascular Disease

## 2016-10-19 NOTE — Progress Notes (Signed)
Cardiology Office Note    Date:  10/22/2016   ID:  Tashawna, Thom 1960-10-03, MRN [id number]  PCP:  [name], PA-C  Cardiologist: Dr. Excell Seltzer   Chief Complaint: yearly follow up  History of Present Illness:   Deanna Hogan is a 56 y.o. female CAD s/p DEs x 2 to prox-mid LAD, ICM with improved LV function  HTN and  HLD presents for   She initially presented with an anterior wall MI in February 2014. She was found to have critical stenosis of the mid LAD and was treated with overlapping drug-eluting stents. Echo 11/2012 showed improved LV function to 50-55% from 35% at time of cath.  She had recurrent chest pain in August 2014 and was admitted with symptoms worrisome for unstable angina. Repeat cardiac catheterization demonstrated patency of the LAD stents and mild nonobstructive disease elsewhere. Medical therapy was recommended.   She was doing well on cardiac stand point when last seen by Dr. Excell Seltzer 07/30/15.   She is here today for follow-up. She has not been taking Coreg for unknown period of time. Noted elevated diastolic blood pressure. She is recovering from a cold/URI for the past 3 weeks. At that time she has noted dizziness and severe headache. Associated with nausea but no vomiting or blurred vision. Mostly happens when she  stand up from sitting or laying position. Her headache has been dissolved however dizziness persisted. 5 days ago she had a episode of left upper chest pain described as a sharp and walking with stock. No associated shortness of breath, nausea, vomiting or radiation of pain. She took sublingual nitroglycerin x 2 with resoluation of pain. No reoccurrence. This pain is different from her MI pain at that time she had a substernal chest pressure with radiation to jaw. She denies orthopnea, PND, lower extremity edema, syncope or melena.   Past Medical History:  Diagnosis Date  . CAD (coronary artery disease)    a. Ant STEMI 10/2012 s/p overlapped DES x 2  to prox-mid LAD, with relook cath showing intramural hematoma mid-distal LAD mg'd medically (unclear if initial mechanism of MI was plaque rupture or dissection).   . Carotid artery disease (HCC)    a. Dopp 10/2012: R <40% ICA stenosis with mild heterogeneous plaque.  [name] HLD (hyperlipidemia)   . HTN (hypertension)   . Ischemic cardiomyopathy    a. Transient - EF 35% at time of STEMI, improved to 55-60% by echo days later.    Past Surgical History:  Procedure Laterality Date  . CORONARY ANGIOPLASTY WITH STENT PLACEMENT    . LEFT HEART CATHETERIZATION WITH CORONARY ANGIOGRAM N/A 10/18/2012   Procedure: LEFT HEART CATHETERIZATION WITH CORONARY ANGIOGRAM;  Surgeon: Tonny Bollman, MD;  Location: [facility] LAB;  Service: Cardiovascular;  Laterality: N/A;  . LEFT HEART CATHETERIZATION WITH CORONARY ANGIOGRAM N/A 10/19/2012   Procedure: LEFT HEART CATHETERIZATION WITH CORONARY ANGIOGRAM;  Surgeon: Peter M Swaziland, MD;  Location: [facility] CATH LAB;  Service: Cardiovascular;  Laterality: N/A;  . LEFT HEART CATHETERIZATION WITH CORONARY ANGIOGRAM N/A 04/04/2013   Procedure: LEFT HEART CATHETERIZATION WITH CORONARY ANGIOGRAM;  Surgeon: Tonny Bollman, MD;  Location: [facility] CATH LAB;  Service: Cardiovascular;  Laterality: N/A;  . PERCUTANEOUS CORONARY STENT INTERVENTION (PCI-S)  10/18/2012   Procedure: PERCUTANEOUS CORONARY STENT INTERVENTION (PCI-S);  Surgeon: Tonny Bollman, MD;  Location: [facility] CATH LAB;  Service: Cardiovascular;;  . TONSILLECTOMY      Current Medications:  Prior to Admission medications   Medication Sig Start Date  End Date Taking? Authorizing Provider  aspirin 81 MG tablet Take 1 tablet (81 mg total) by mouth daily. 10/22/12  Yes Dayna N Dunn, PA-C  atorvastatin (LIPITOR) 80 MG tablet Take 1 tablet (80 mg total) by mouth daily. *Patient keep upcoming appointment for further refills* 10/14/16  Yes Tonny Bollman, MD  clopidogrel (PLAVIX) 75 MG tablet Take 1 tablet (75 mg total) by mouth daily. *Please  keep 10/22/16 appointment for further refills* 10/17/16  Yes Tonny Bollman, MD  fluticasone [facility]) 50 MCG/ACT nasal spray Place 2 sprays in each nostril once daily for allergy symptoms 04/17/14  Yes Lattie Haw, MD  LORazepam (ATIVAN) 0.5 MG tablet TAKE 1 TABLET(0.5 MG) BY MOUTH EVERY 8 HOURS AS NEEDED FOR ANXIETY 10/09/16  Yes Historical Provider, MD  losartan (COZAAR) 50 MG tablet Take 1 tablet (50 mg total) by mouth daily. *Patient is overdue for an appointment and needs to call and schedule for further refills* 09/22/16  Yes Tonny Bollman, MD  nitroGLYCERIN (NITROSTAT) 0.4 MG SL tablet Place 1 tablet (0.4 mg total) under the tongue every 5 (five) minutes as needed for chest pain (up to 3 doses). 08/17/14  Yes Tonny Bollman, MD  venlafaxine XR (EFFEXOR XR) 75 MG 24 hr capsule Take 75 mg by mouth daily. 04/23/15  Yes Historical Provider, MD     Allergies:   Lisinopril; Morphine and related; and Penicillins   Social History   Social History  . Marital status: Married    Spouse name: N/A  . Number of children: N/A  . Years of education: N/A   Social History Main Topics  . Smoking status: Never Smoker  . Smokeless tobacco: Never Used  . Alcohol use No  . Drug use: No  . Sexual activity: Not on file   Other Topics Concern  . Not on file   Social History Narrative  . No narrative on file     Family History:  The patient's family history includes ALS in her father; Healthy in her sister; Heart attack (age of onset: 12) in her father; Heart disease in her father and mother; Hypertension in her mother.   ROS:   Please see the history of present illness.    ROS All other systems reviewed and are negative.   PHYSICAL EXAM:   VS:  BP (!) 149/93 (BP Location: Left Arm, Cuff Size: Large)   Pulse 66   Ht 5\' 6"  (1.676 m)   Wt 214 lb (97.1 kg)   BMI 34.54 kg/m    GEN: Well nourished, well developed, in no acute distress  HEENT: normal  Neck: no JVD, carotid bruits, or  masses Cardiac: RRR; no murmurs, rubs, or gallops,no edema  Respiratory:  clear to auscultation bilaterally, normal work of breathing GI: soft, nontender, nondistended, + BS MS: no deformity or atrophy  Skin: warm and dry, no rash Neuro:  Alert and Oriented x 3, Strength and sensation are intact Psych: euthymic mood, full affect  Wt Readings from Last 3 Encounters:  10/22/16 214 lb (97.1 kg)  07/30/15 213 lb 6.4 oz (96.8 kg)  05/09/14 209 lb 1.9 oz (94.9 kg)      Studies/Labs Reviewed:   EKG:  EKG is ordered today.  The ekg ordered today demonstrates NSR. No acute changes.   Recent Labs: No results found for requested labs within last 8760 hours.   Lipid Panel    Component Value Date/Time   CHOL 159 05/12/2014 1142   TRIG 68.0 05/12/2014 1142  HDL 46.30 05/12/2014 1142   CHOLHDL 3 05/12/2014 1142   VLDL 13.6 05/12/2014 1142   LDLCALC 99 05/12/2014 1142    Additional studies/ records that were reviewed today include:   Echocardiogram: 12/24/12 Study Conclusions  - Left ventricle: The cavity size was normal. Wall thickness was normal. Systolic function was normal. The estimated ejection fraction was in the range of 50% to 55%. Wall motion was normal; there were no regional wall motion abnormalities. Left ventricular diastolic function parameters were normal. - Mitral valve: Mild regurgitation.  Cardiac Catheterization: 04/2013 Coronary angiography: Coronary dominance: right  Left mainstem: Arises from left cusp. Patent without significant disease.  Left anterior descending (LAD): patent throughout. Large vessel reaches LV apex. Overlapping stents in mid-vessel patent without restenosis.  Left circumflex (LCx): Large vessel. Minor plaque in mid-vessel. OM1 is a large vessel without stenosis  Right coronary artery (RCA): Large, dominant vessel. Widely patent without obstructive disease. PDA patent without disease.  Left ventriculography: Left  ventricular systolic function is normal, LVEF is estimated at 55-65%, there is no significant mitral regurgitation   Final Conclusions:   1. Widely patent stents in LAD 2. No obstructive CAD 3. Normal LV function  Recommendations: continue medical therapy. D/c home this afternoon.  Cardiac Catheterization: 10/2012 Coronary angiography: Coronary dominance: right  Left mainstem: Normal.  Left anterior descending (LAD): The LAD is widely patent in the proximal to mid vessel. The stents are widely patent and both diagonal branches are without compromise. In the mid to distal LAD there is a long segment of diffuse narrowing up to 70-80%. There is TIMI 3 flow. No intimal flaps or dye hangup are noted.  Left circumflex (LCx): Mild irregularities less than 20%.  Right coronary artery (RCA): Dominant vessel  is normal.    Final Conclusions:   1. Single vessel coronary disease. The stents in the proximal to mid LAD are widely patent. There is a long segment of narrowing in the mid to distal LAD that is new. The appearance would be consistent with intramural hematoma.  Recommendations: We'll manage medically. Continue IV nitroglycerin and analgesics. Switch Effient to Plavix.  ASSESSMENT & PLAN:    1. CAD s/p overlapping DES to mid LAD - She did required SL nitro x 2 few days ago for chest pain with complete resolutio. This was different from prior MI pain. Continue ASA, plavix and statin. I will discuss with Dr. Excell Seltzer if need to discontinue plavix given more than 1 year of stent placement. She is planning to start exercise program. Agree with starting however she will let us know if recurrent chest pain. At that time will required ischemic evaluation.   2. HLD - No results found for requested labs within last 8760 hours. Will repeat Lipid panel and LFT. Continue liptior 80mg  qd. She does Complains of right leg pain questionable muscle versus bone pain. Suspicious for PVD. If LDL less  than 70, will reduce lipitor dose for trial.   3. HTN - Elevated diastolic BP. Seems she is off beta blocker (Coreg) due to fatigue and tiredness. This has been improved since discontinuation. Will increase losartan to 75mg  qd. Keep log.   4. Dizziness - Not orthostatic on vital today. Would be due to recent URI/cold. He is to follow-up with PCP if worsening symptoms. She may try compression stocking.   Medication Adjustments/Labs and Tests Ordered: Current medicines are reviewed at length with the patient today.  Concerns regarding medicines are outlined above.  Medication changes, Labs and  Tests ordered today are listed in the Patient Instructions below. Patient Instructions  Medication Instructions:  Your physician has recommended you make the following change in your medication:  INCREASE Losartan to 75mg  daily. An Rx has been sent to your pharmacy  Your cardiac medications have been refilled today   Labwork: Your physician recommends that you return for a FASTING lipid profile and lft on 10/24/16. Our lab hours are 7:30am-5pm   Testing/Procedures: None ordered  Follow-Up: Your physician wants you to follow-up in: 4 months with Dr.Cooper You will receive a reminder letter in the mail two months in advance. If you don't receive a letter, please call our office to schedule the follow-up appointment.   Any Other Special Instructions Will Be Listed Below (If Applicable).     If you need a refill on your cardiac medications before your next appointment, please call your pharmacy.      Lorelei PontSigned, Wauneta Silveria, [place]  10/22/2016 11:02 AM    [facility] [address], [place], [place]  [id number] Phone: [phone]; Fax: [phone]

## 2016-10-21 ENCOUNTER — Other Ambulatory Visit: Payer: Self-pay | Admitting: Cardiovascular Disease

## 2016-10-22 ENCOUNTER — Ambulatory Visit (INDEPENDENT_AMBULATORY_CARE_PROVIDER_SITE_OTHER): Payer: Self-pay | Admitting: Physician Assistant

## 2016-10-22 VITALS — BP 149/93 | HR 66 | Ht 66.0 in | Wt 214.0 lb

## 2016-10-22 DIAGNOSIS — I2510 Atherosclerotic heart disease of native coronary artery without angina pectoris: Secondary | ICD-10-CM

## 2016-10-22 DIAGNOSIS — E785 Hyperlipidemia, unspecified: Secondary | ICD-10-CM

## 2016-10-22 DIAGNOSIS — R42 Dizziness and giddiness: Secondary | ICD-10-CM

## 2016-10-22 DIAGNOSIS — I10 Essential (primary) hypertension: Secondary | ICD-10-CM

## 2016-10-22 MED ORDER — ATORVASTATIN CALCIUM 80 MG PO TABS: 80.0000 mg | ORAL_TABLET | Freq: Every day | ORAL | 3 refills | Status: DC

## 2016-10-22 MED ORDER — CLOPIDOGREL BISULFATE 75 MG PO TABS: 75.0000 mg | ORAL_TABLET | Freq: Every day | ORAL | 3 refills | Status: DC

## 2016-10-22 MED ORDER — LOSARTAN POTASSIUM 50 MG PO TABS: 75.0000 mg | ORAL_TABLET | Freq: Every day | ORAL | 3 refills | Status: DC

## 2016-10-22 NOTE — Patient Instructions (Addendum)
Medication Instructions:  Your physician has recommended you make the following change in your medication:  INCREASE Losartan to 75mg  daily. An Rx has been sent to your pharmacy  Your cardiac medications have been refilled today   Labwork: Your physician recommends that you return for a FASTING lipid profile and lft on 10/24/16. Our lab hours are 7:30am-5pm   Testing/Procedures: None ordered  Follow-Up: Your physician wants you to follow-up in: 4 months with Dr.Cooper You will receive a reminder letter in the mail two months in advance. If you don't receive a letter, please call our office to schedule the follow-up appointment.   Any Other Special Instructions Will Be Listed Below (If Applicable).     If you need a refill on your cardiac medications before your next appointment, please call your pharmacy.

## 2016-10-24 ENCOUNTER — Other Ambulatory Visit: Payer: Self-pay

## 2016-11-09 NOTE — Progress Notes (Signed)
Should be ok to stop plavix now several years out from PCI

## 2016-11-10 NOTE — Progress Notes (Signed)
11/10/16 9:51 AM  Left message on machine for pt to contact the office to discuss plavix.

## 2016-11-11 ENCOUNTER — Telehealth: Payer: Self-pay | Admitting: Cardiovascular Disease

## 2016-11-11 ENCOUNTER — Other Ambulatory Visit: Payer: Self-pay | Admitting: Cardiovascular Disease

## 2016-11-11 NOTE — Progress Notes (Signed)
11/11/16 12:50 PM  Left message on machine for pt to contact the office.

## 2016-11-11 NOTE — Telephone Encounter (Signed)
Returning your call from today. °

## 2016-11-11 NOTE — Progress Notes (Signed)
11/11/16 1:25 PM  I spoke with the pt and made her aware that she can stop plavix per Dr Earmon Phoenixooper's recommendation. Medication list updated.

## 2016-11-11 NOTE — Telephone Encounter (Signed)
Per last office visit the pt can stop plavix. I made the pt aware of this information and medication list updated.

## 2016-11-12 NOTE — Telephone Encounter (Signed)
Medication Detail    Disp Refills Start End   atorvastatin (LIPITOR) 80 MG tablet 90 tablet 3 10/22/2016    Sig - Route: Take 1 tablet (80 mg total) by mouth daily. - Oral   E-Prescribing Status: Receipt confirmed by pharmacy (10/22/2016 10:34 AM EST)   Ph[facility]   [id number] - [place], [place] - [address]

## 2016-11-27 ENCOUNTER — Telehealth: Payer: Self-pay | Admitting: Cardiovascular Disease

## 2016-11-27 NOTE — Telephone Encounter (Signed)
Returned call to patient she stated she needs a prior authorization for Losartan 10 mg.She takes 1&1/2 tablets daily.Insurance is [employer] member ID # [id number].Message sent to prior authorization nurses Larita FifeLynn and AdaLinda.

## 2016-11-27 NOTE — Telephone Encounter (Signed)
Deanna Hogan is calling because her  Losartan was increased to 15mg   And her insurance says that they will only pay for a 10mg . So she is needing an increased in the quantity .  Please send to [facility] in [place] .  Also she is asking that someone call her once this has been taken care of . Thanks

## 2016-12-04 ENCOUNTER — Other Ambulatory Visit: Payer: Self-pay

## 2016-12-04 DIAGNOSIS — I2510 Atherosclerotic heart disease of native coronary artery without angina pectoris: Secondary | ICD-10-CM

## 2016-12-04 DIAGNOSIS — I10 Essential (primary) hypertension: Secondary | ICD-10-CM

## 2016-12-04 MED ORDER — LOSARTAN POTASSIUM 50 MG PO TABS: 75.0000 mg | ORAL_TABLET | Freq: Every day | ORAL | 3 refills | Status: DC

## 2016-12-04 NOTE — Telephone Encounter (Signed)
We never received a PA request from the pts pharmacy so I did PA through covermymeds. Awaiting response.

## 2016-12-09 ENCOUNTER — Telehealth: Payer: Self-pay | Admitting: *Deleted

## 2016-12-09 NOTE — Telephone Encounter (Signed)
PA done for LOSARTAN done via covermymeds

## 2016-12-10 ENCOUNTER — Telehealth: Payer: Self-pay

## 2016-12-10 NOTE — Telephone Encounter (Signed)
We received a PA approval from [facility] on the pts Losartan 75 mg. I have faxed approval letter to [facility].

## 2017-01-28 ENCOUNTER — Encounter: Payer: Self-pay | Admitting: Cardiovascular Disease

## 2017-02-16 ENCOUNTER — Encounter: Payer: Self-pay | Admitting: Cardiovascular Disease

## 2017-02-16 ENCOUNTER — Encounter (INDEPENDENT_AMBULATORY_CARE_PROVIDER_SITE_OTHER): Payer: Self-pay

## 2017-02-16 ENCOUNTER — Ambulatory Visit (INDEPENDENT_AMBULATORY_CARE_PROVIDER_SITE_OTHER): Payer: BLUE CROSS/BLUE SHIELD | Admitting: Cardiovascular Disease

## 2017-02-16 VITALS — BP 138/90 | HR 70 | Ht 66.0 in | Wt 210.4 lb

## 2017-02-16 DIAGNOSIS — I10 Essential (primary) hypertension: Secondary | ICD-10-CM

## 2017-02-16 DIAGNOSIS — E785 Hyperlipidemia, unspecified: Secondary | ICD-10-CM

## 2017-02-16 DIAGNOSIS — I2510 Atherosclerotic heart disease of native coronary artery without angina pectoris: Secondary | ICD-10-CM

## 2017-02-16 NOTE — Patient Instructions (Addendum)
Medication Instructions:  Your physician recommends that you continue on your current medications as directed. Please refer to the Current Medication list given to you today.  Labwork: Your physician recommends that you return for a FASTING LIPID and LIVER with Primary Care office--nothing to eat or drink after midnight (order given to the patient)  Testing/Procedures: No new orders.   Follow-Up: Your physician wants you to follow-up in: 1 YEAR with Dr Excell Seltzerooper. You will receive a reminder letter in the mail two months in advance. If you don't receive a letter, please call our office to schedule the follow-up appointment.   Any Other Special Instructions Will Be Listed Below (If Applicable).     If you need a refill on your cardiac medications before your next appointment, please call your pharmacy.

## 2017-02-16 NOTE — Progress Notes (Signed)
Cardiology Office Note Date:  02/18/2017   ID:  [name], [name] 29-Sep-1960, MRN [id number]  PCP:  [name], PA-C  Cardiologist:  Tonny Bollman, MD    Chief Complaint  Patient presents with  . Follow-up     History of Present Illness: Deanna Hogan is a 56 y.o. female who presents for follow-up of CAD.   She initially presented with an anterior wall MI in February 2014. She was found to have critical stenosis of the mid LAD and was treated with overlapping drug-eluting stents. Echo 11/2012 showed improved LV function to 50-55% from 35% at time of cath.  She had recurrent chest pain in August 2014 and was admitted with symptoms worrisome for unstable angina. Repeat cardiac catheterization demonstrated patency of the LAD stents and mild nonobstructive disease elsewhere. Medical therapy was recommended.   The patient is here alone today. She is doing quite well. She's taken an interest in scuba diving and has been doing some water exercises to get ready for this. She's had no exertional symptoms. Specifically denies chest pain, chest pressure, or shortness of breath. Medications are unchanged. She did stop clopidogrel several months ago after she was evaluated here. She still having some bruising on low-dose aspirin but no significant bleeding problems.  Past Medical History:  Diagnosis Date  . CAD (coronary artery disease)    a. Ant STEMI 10/2012 s/p overlapped DES x 2 to prox-mid LAD, with relook cath showing intramural hematoma mid-distal LAD mg'd medically (unclear if initial mechanism of MI was plaque rupture or dissection).   . Carotid artery disease (HCC)    a. Dopp 10/2012: R <40% ICA stenosis with mild heterogeneous plaque.  [name] HLD (hyperlipidemia)   . HTN (hypertension)   . Ischemic cardiomyopathy    a. Transient - EF 35% at time of STEMI, improved to 55-60% by echo days later.    Past Surgical History:  Procedure Laterality Date  . CORONARY ANGIOPLASTY WITH STENT  PLACEMENT    . LEFT HEART CATHETERIZATION WITH CORONARY ANGIOGRAM N/A 10/18/2012   Procedure: LEFT HEART CATHETERIZATION WITH CORONARY ANGIOGRAM;  Surgeon: Tonny Bollman, MD;  Location: [facility] CATH LAB;  Service: Cardiovascular;  Laterality: N/A;  . LEFT HEART CATHETERIZATION WITH CORONARY ANGIOGRAM N/A 10/19/2012   Procedure: LEFT HEART CATHETERIZATION WITH CORONARY ANGIOGRAM;  Surgeon: Peter M Swaziland, MD;  Location: [facility] CATH LAB;  Service: Cardiovascular;  Laterality: N/A;  . LEFT HEART CATHETERIZATION WITH CORONARY ANGIOGRAM N/A 04/04/2013   Procedure: LEFT HEART CATHETERIZATION WITH CORONARY ANGIOGRAM;  Surgeon: Tonny Bollman, MD;  Location: [facility] CATH LAB;  Service: Cardiovascular;  Laterality: N/A;  . PERCUTANEOUS CORONARY STENT INTERVENTION (PCI-S)  10/18/2012   Procedure: PERCUTANEOUS CORONARY STENT INTERVENTION (PCI-S);  Surgeon: Tonny Bollman, MD;  Location: [facility] CATH LAB;  Service: Cardiovascular;;  . TONSILLECTOMY      Current Outpatient Prescriptions  Medication Sig Dispense Refill  . aspirin 81 MG tablet Take 1 tablet (81 mg total) by mouth daily.    [name] atorvastatin (LIPITOR) 80 MG tablet Take 1 tablet (80 mg total) by mouth daily. 90 tablet 3  . fluticasone (FLONASE) 50 MCG/ACT nasal spray Place 2 sprays in each nostril once daily for allergy symptoms 16 g 1  . LORazepam (ATIVAN) 0.5 MG tablet TAKE 1 TABLET(0.5 MG) BY MOUTH EVERY 8 HOURS AS NEEDED FOR ANXIETY    . losartan (COZAAR) 50 MG tablet Take 1.5 tablets (75 mg total) by mouth daily. 135 tablet 3  . nitroGLYCERIN (NITROSTAT) 0.4  MG SL tablet Place 1 tablet (0.4 mg total) under the tongue every 5 (five) minutes as needed for chest pain (up to 3 doses). 25 tablet 5  . venlafaxine XR (EFFEXOR-XR) 150 MG 24 hr capsule Take 150 mg by mouth daily.  3   No current facility-administered medications for this visit.     Allergies:   Lisinopril; Morphine and related; and Penicillins   Social History:  The patient  reports that she has never  smoked. She has never used smokeless tobacco. She reports that she does not drink alcohol or use drugs.   Family History:  The patient's  family history includes ALS in her father; Healthy in her sister; Heart attack (age of onset: 8950) in her father; Heart disease in her father and mother; Hypertension in her mother.    ROS:  Please see the history of present illness.   All other systems are reviewed and negative.    PHYSICAL EXAM: VS:  BP 138/90   Pulse 70   Ht 5\' 6"  (1.676 m)   Wt 210 lb 6.4 oz (95.4 kg)   BMI 33.96 kg/m  , BMI Body mass index is 33.96 kg/m. GEN: Well nourished, well developed, in no acute distress  HEENT: normal  Neck: no JVD, no masses. No carotid bruits Cardiac: RRR without murmur or gallop                Respiratory:  clear to auscultation bilaterally, normal work of breathing GI: soft, nontender, nondistended, + BS MS: no deformity or atrophy  Ext: no pretibial edema, pedal pulses 2+= bilaterally Skin: warm and dry, no rash Neuro:  Strength and sensation are intact Psych: euthymic mood, full affect  EKG:  EKG is not ordered today.  Recent Labs: No results found for requested labs within last 8760 hours.   Lipid Panel     Component Value Date/Time   CHOL 159 05/12/2014 1142   TRIG 68.0 05/12/2014 1142   HDL 46.30 05/12/2014 1142   CHOLHDL 3 05/12/2014 1142   VLDL 13.6 05/12/2014 1142   LDLCALC 99 05/12/2014 1142      Wt Readings from Last 3 Encounters:  02/16/17 210 lb 6.4 oz (95.4 kg)  10/22/16 214 lb (97.1 kg)  07/30/15 213 lb 6.4 oz (96.8 kg)     ASSESSMENT AND PLAN: 1.  CAD, native vessel, without angina: The patient is doing well from a cardiac perspective. She's had no recent chest pain or pressure. She's been physically active without exertional symptoms. She brings in paperwork for scuba diving medical clearance and this is filled out today. I do not see any contraindications she has stable coronary artery disease with no anginal  symptoms and has no symptoms of heart failure.  2. Hyperlipidemia: The patient takes atorvastatin 80 mg. She would like to have lipids checked by her primary care physician. We wrote a prescription order for lipids and LFTs today.  Current medicines are reviewed with the patient today.  The patient does not have concerns regarding medicines.  Labs/ tests ordered today include:  No orders of the defined types were placed in this encounter.   Disposition:   FU one year  Signed, Tonny Bollmanooper, Daimon Kean, MD  02/18/2017 10:45 PM    [facility] [address], [place], [place]  [address] Phone: [phone]) [phone]; Fax: [phone]

## 2017-08-26 ENCOUNTER — Other Ambulatory Visit: Payer: Self-pay | Admitting: Physician Assistant

## 2017-10-03 ENCOUNTER — Other Ambulatory Visit: Payer: Self-pay

## 2017-10-03 ENCOUNTER — Encounter: Payer: Self-pay | Admitting: Emergency Medicine

## 2017-10-03 ENCOUNTER — Emergency Department
Admission: EM | Admit: 2017-10-03 | Discharge: 2017-10-03 | Disposition: A | Discharge status: Home / Self Care | Payer: Self-pay | Source: Home / Self Care | Attending: Family Medicine | Admitting: Family Medicine

## 2017-10-03 ENCOUNTER — Emergency Department (INDEPENDENT_AMBULATORY_CARE_PROVIDER_SITE_OTHER): Payer: Self-pay

## 2017-10-03 DIAGNOSIS — R05 Cough: Secondary | ICD-10-CM

## 2017-10-03 DIAGNOSIS — J40 Bronchitis, not specified as acute or chronic: Secondary | ICD-10-CM

## 2017-10-03 MED ORDER — DOXYCYCLINE HYCLATE 100 MG PO CAPS: 100.0000 mg | ORAL_CAPSULE | Freq: Two times a day (BID) | ORAL | 0 refills | Status: DC

## 2017-10-03 MED ORDER — BENZONATATE 200 MG PO CAPS: ORAL_CAPSULE | ORAL | 0 refills | Status: DC

## 2017-10-03 MED ORDER — METHYLPREDNISOLONE SODIUM SUCC 125 MG IJ SOLR
80.0000 mg | Freq: Once | INTRAMUSCULAR | Status: AC
  Administered 2017-10-03: 80 mg via INTRAMUSCULAR

## 2017-10-03 MED ORDER — PREDNISONE 20 MG PO TABS: ORAL_TABLET | ORAL | 0 refills | Status: DC

## 2017-10-03 NOTE — ED Triage Notes (Signed)
Patient reports cough and congestion for a month; just finished course of antibiotics 3 days ago and this has not made an improvement.

## 2017-10-03 NOTE — ED Provider Notes (Signed)
[facility]    CSN: [id number] Arrival date & time: 10/03/17  1727     History   Chief Complaint Chief Complaint  Patient presents with  . Cough    HPI Deanna Hogan is a 57 y.o. female.   Patient complains of persistent cough and congestion for a month.  No pleuritic pain or shortness of breath.  No fevers, chills, and sweats.  She states that she just finished a course of Keflex three days ago without improvement.   The history is provided by the patient.    Past Medical History:  Diagnosis Date  . CAD (coronary artery disease)    a. Ant STEMI 10/2012 s/p overlapped DES x 2 to prox-mid LAD, with relook cath showing intramural hematoma mid-distal LAD mg'd medically (unclear if initial mechanism of MI was plaque rupture or dissection).   . Carotid artery disease (HCC)    a. Dopp 10/2012: R <40% ICA stenosis with mild heterogeneous plaque.  [name]. HLD (hyperlipidemia)   . HTN (hypertension)   . Ischemic cardiomyopathy    a. Transient - EF 35% at time of STEMI, improved to 55-60% by echo days later.    Patient Active Problem List   Diagnosis Date Noted  . CAD (coronary artery disease) 10/21/2012  . STEMI (ST elevation myocardial infarction) (HCC) 10/18/2012  . HTN (hypertension) 10/18/2012  . HLD (hyperlipidemia) 10/18/2012    Past Surgical History:  Procedure Laterality Date  . CORONARY ANGIOPLASTY WITH STENT PLACEMENT    . LEFT HEART CATHETERIZATION WITH CORONARY ANGIOGRAM N/A 10/18/2012   Procedure: LEFT HEART CATHETERIZATION WITH CORONARY ANGIOGRAM;  Surgeon: Tonny BollmanMichael Cooper, MD;  [facility];  Service: Cardiovascular;  Laterality: N/A;  . LEFT HEART CATHETERIZATION WITH CORONARY ANGIOGRAM N/A 10/19/2012   Procedure: LEFT HEART CATHETERIZATION WITH CORONARY ANGIOGRAM;  Surgeon: Peter M SwazilandJordan, MD;  [facility];  Service: Cardiovascular;  Laterality: N/A;  . LEFT HEART CATHETERIZATION WITH CORONARY ANGIOGRAM N/A 04/04/2013   Procedure: LEFT  HEART CATHETERIZATION WITH CORONARY ANGIOGRAM;  Surgeon: Tonny BollmanMichael Cooper, MD;  [facility];  Service: Cardiovascular;  Laterality: N/A;  . PERCUTANEOUS CORONARY STENT INTERVENTION (PCI-S)  10/18/2012   Procedure: PERCUTANEOUS CORONARY STENT INTERVENTION (PCI-S);  Surgeon: Tonny BollmanMichael Cooper, MD;  [facility];  Service: Cardiovascular;;  . TONSILLECTOMY      OB History    No data available       Home Medications    Prior to Admission medications   Medication Sig Start Date End Date Taking? Authorizing Provider  clopidogrel (PLAVIX) 75 MG tablet Take 75 mg by mouth daily.   Yes [provider]  aspirin 81 MG tablet Take 1 tablet (81 mg total) by mouth daily. 10/22/12   Dunn, Tacey Ruizayna N, PA-C  atorvastatin (LIPITOR) 80 MG tablet TAKE 1 TABLET(80 MG) BY MOUTH DAILY 08/27/17   Tonny Bollmanooper, Michael, MD  benzonatate (TESSALON) 200 MG capsule Take one cap by mouth at bedtime as needed for cough.  May repeat in 4 to 6 hours 10/03/17   Lattie HawBeese, Deseree Zemaitis A, MD  doxycycline (VIBRAMYCIN) 100 MG capsule Take 1 capsule (100 mg total) by mouth 2 (two) times daily. Take with food. 10/03/17   Lattie HawBeese, Dionisio Aragones A, MD  fluticasone Aleda Grana(FLONASE) 50 MCG/ACT nasal spray Place 2 sprays in each nostril once daily for allergy symptoms 04/17/14   Lattie HawBeese, Dianelys Scinto A, MD  LORazepam (ATIVAN) 0.5 MG tablet TAKE 1 TABLET(0.5 MG) BY MOUTH EVERY 8 HOURS AS NEEDED FOR ANXIETY 10/09/16  [provider]  losartan (COZAAR) 50 MG tablet Take 1.5 tablets (75 mg total) by mouth daily. 12/04/16   Tonny Bollman, MD  nitroGLYCERIN (NITROSTAT) 0.4 MG SL tablet Place 1 tablet (0.4 mg total) under the tongue every 5 (five) minutes as needed for chest pain (up to 3 doses). 08/17/14   Tonny Bollman, MD  predniSONE (DELTASONE) 20 MG tablet Take one tab by mouth twice daily for 5 days, then one daily for 3 days. Take with food. 10/03/17   Lattie Haw, MD  venlafaxine XR (EFFEXOR-XR) 150 MG 24 hr capsule Take 150 mg by mouth daily.  02/01/17   [provider]    Family History Family History  Problem Relation Age of Onset  . Heart attack Father 39  . ALS Father   . Heart disease Father   . Heart disease Mother   . Hypertension Mother   . Healthy Sister     Social History Social History   Tobacco Use  . Smoking status: Never Smoker  . Smokeless tobacco: Never Used  Substance Use Topics  . Alcohol use: No  . Drug use: No     Allergies   Lisinopril; Morphine and related; and Penicillins   Review of Systems Review of Systems No sore throat + cough No pleuritic pain No wheezing + nasal congestion No post-nasal drainage No sinus pain/pressure No itchy/red eyes No earache No hemoptysis No SOB No fever/chills No nausea No vomiting No abdominal pain No diarrhea No urinary symptoms No skin rash No fatigue No myalgias No headache Used OTC meds without relief   Physical Exam Triage Vital Signs ED Triage Vitals [10/03/17 1801]  Enc Vitals Group     BP 113/73     Pulse Rate 88     Resp 18     Temp 98.8 F (37.1 C)     Temp Source Oral     SpO2 96 %     Weight 210 lb (95.3 kg)     Height 5\' 6"  (1.676 m)     Head Circumference      Peak Flow      Pain Score      Pain Loc      Pain Edu?      Excl. in GC?    No data found.  Updated Vital Signs BP 113/73 (BP Location: Right Arm)   Pulse 88   Temp 98.8 F (37.1 C) (Oral)   Resp 18   Ht 5\' 6"  (1.676 m)   Wt 210 lb (95.3 kg)   LMP 04/21/2013   SpO2 96%   BMI 33.89 kg/m   Visual Acuity Right Eye Distance:   Left Eye Distance:   Bilateral Distance:    Right Eye Near:   Left Eye Near:    Bilateral Near:     Physical Exam Nursing notes and Vital Signs reviewed. Appearance:  Patient appears stated age, and in no acute distress Eyes:  Pupils are equal, round, and reactive to light and accomodation.  Extraocular movement is intact.  Conjunctivae are not inflamed  Ears:  Canals normal.  Tympanic membranes normal.    Nose:  Mildly congested turbinates.  No sinus tenderness.  Pharynx:  Normal Neck:  Supple. No adenopathy Lungs:  Clear to auscultation.  Breath sounds are equal.  Moving air well. Heart:  Regular rate and rhythm without murmurs, rubs, or gallops.  Abdomen:  Nontender without masses or hepatosplenomegaly.  Bowel sounds are present.  No CVA or flank  tenderness.  Extremities:  No edema.  Skin:  No rash present.    UC Treatments / Results  Labs (all labs ordered are listed, but only abnormal results are displayed) Labs Reviewed - No data to display  EKG  EKG Interpretation None       Radiology Dg Chest 2 View  Result Date: 10/03/2017 CLINICAL DATA:  One month history of productive cough. Personal history of coronary artery disease with stenting. EXAM: CHEST  2 VIEW COMPARISON:  04/02/2013, 10/18/2012. FINDINGS: Cardiomediastinal silhouette unremarkable, unchanged. Mild central peribronchial thickening, more so than on the prior examination. Lungs otherwise clear. No localized airspace consolidation. No pleural effusions. No pneumothorax. Normal pulmonary vascularity. Degenerative changes involving the thoracic and upper lumbar spine. IMPRESSION: Mild changes of acute bronchitis and/or asthma without focal airspace pneumonia. Electronically Signed   By: Hulan Saas M.D.   On: 10/03/2017 18:30    Procedures Procedures (including critical care time)  Medications Ordered in UC Medications  methylPREDNISolone sodium succinate (SOLU-MEDROL) 125 mg/2 mL injection 80 mg (not administered)     Initial Impression / Assessment and Plan / UC Course  I have reviewed the triage vital signs and the nursing notes.  Pertinent labs & imaging results that were available during my care of the patient were reviewed by me and considered in my medical decision making (see chart for details).    Administered Solumedrol 80mg  IM. Begin doxycycline 100mg  BID for atypical coverage. Prescription  written for Benzonatate [facility]) to take at bedtime for night-time cough.  Begin prednisone burst/taper Sunday 10/04/17. Take plain guaifenesin (1200mg extended release tabs such as Mucinex) twice daily, with plenty of water, for cough and congestion.  Get adequate rest.   For nasal congestion may use Afrin nasal spray (or generic oxymetazoline) each morning for about 5 days and then discontinue.  Also recommend using saline nasal spray several times daily and saline nasal irrigation (AYR is a common brand).  Use Flonase nasal spray each morning after using Afrin nasal spray and saline nasal irrigation. Try warm salt water gargles for sore throat.  Stop all antihistamines for now, and other non-prescription cough/cold preparations. May take Delsym Cough Suppressant at bedtime for nighttime cough.  Follow-up with family doctor if not improving about 7 to 10 days.    Final Clinical Impressions(s) / UC Diagnoses   Final diagnoses:  Bronchitis    ED Discharge Orders        Ordered    predniSONE (DELTASONE) 20 MG tablet     10/03/17 1915    doxycycline (VIBRAMYCIN) 100 MG capsule  2 times daily     10/03/17 1915    benzonatate (TESSALON) 200 MG capsule     02 /02/19 1915           Lattie Haw, MD 10/06/17 1234

## 2017-10-03 NOTE — Discharge Instructions (Signed)
Begin prednisone Sunday 10/04/17. Take plain guaifenesin (1200mg  extended release tabs such as Mucinex) twice daily, with plenty of water, for cough and congestion.  Get adequate rest.   For nasal congestion may use Afrin nasal spray (or generic oxymetazoline) each morning for about 5 days and then discontinue.  Also recommend using saline nasal spray several times daily and saline nasal irrigation (AYR is a common brand).  Use Flonase nasal spray each morning after using Afrin nasal spray and saline nasal irrigation. Try warm salt water gargles for sore throat.  Stop all antihistamines for now, and other non-prescription cough/cold preparations. May take Delsym Cough Suppressant at bedtime for nighttime cough.

## 2017-10-21 NOTE — Telephone Encounter (Signed)
Approval good from 12/04/16 until 08/31/2038 per fax from [employer].

## 2017-11-26 ENCOUNTER — Other Ambulatory Visit: Payer: Self-pay | Admitting: Cardiovascular Disease

## 2017-11-26 DIAGNOSIS — I10 Essential (primary) hypertension: Secondary | ICD-10-CM

## 2017-11-26 DIAGNOSIS — I2510 Atherosclerotic heart disease of native coronary artery without angina pectoris: Secondary | ICD-10-CM

## 2018-02-16 ENCOUNTER — Other Ambulatory Visit: Payer: Self-pay | Admitting: Cardiovascular Disease

## 2018-02-16 DIAGNOSIS — I10 Essential (primary) hypertension: Secondary | ICD-10-CM

## 2018-02-16 DIAGNOSIS — I2510 Atherosclerotic heart disease of native coronary artery without angina pectoris: Secondary | ICD-10-CM

## 2018-03-24 ENCOUNTER — Other Ambulatory Visit: Payer: Self-pay | Admitting: Cardiovascular Disease

## 2018-03-24 DIAGNOSIS — I2510 Atherosclerotic heart disease of native coronary artery without angina pectoris: Secondary | ICD-10-CM

## 2018-03-24 DIAGNOSIS — I10 Essential (primary) hypertension: Secondary | ICD-10-CM

## 2018-04-09 ENCOUNTER — Other Ambulatory Visit: Payer: Self-pay | Admitting: Cardiovascular Disease

## 2018-04-09 DIAGNOSIS — I2510 Atherosclerotic heart disease of native coronary artery without angina pectoris: Secondary | ICD-10-CM

## 2018-04-09 DIAGNOSIS — I10 Essential (primary) hypertension: Secondary | ICD-10-CM

## 2018-04-12 ENCOUNTER — Other Ambulatory Visit: Payer: Self-pay | Admitting: *Deleted

## 2018-04-12 ENCOUNTER — Telehealth: Payer: Self-pay | Admitting: Cardiovascular Disease

## 2018-04-12 DIAGNOSIS — I10 Essential (primary) hypertension: Secondary | ICD-10-CM

## 2018-04-12 DIAGNOSIS — I2510 Atherosclerotic heart disease of native coronary artery without angina pectoris: Secondary | ICD-10-CM

## 2018-04-12 MED ORDER — LOSARTAN POTASSIUM 50 MG PO TABS: 75.0000 mg | ORAL_TABLET | Freq: Every day | ORAL | 1 refills | Status: DC

## 2018-04-12 NOTE — Telephone Encounter (Signed)
New Message     *STAT* If patient is at the pharmacy, call can be transferred to refill team.   1. Which medications need to be refilled? (please list name of each medication and dose if known) losartan (COZAAR) 50 MG tablet  2. Which pharmacy/location (including street and city if local pharmacy) is medication to be sent to? [facility] [place], [place] - [phone]  3. Do they need a 30 day or 90 day supply? 30

## 2018-05-18 ENCOUNTER — Ambulatory Visit (INDEPENDENT_AMBULATORY_CARE_PROVIDER_SITE_OTHER): Payer: Self-pay | Admitting: Physician Assistant

## 2018-05-18 ENCOUNTER — Encounter: Payer: Self-pay | Admitting: Physician Assistant

## 2018-05-18 VITALS — BP 120/90 | HR 77 | Ht 66.0 in | Wt 222.4 lb

## 2018-05-18 DIAGNOSIS — I2510 Atherosclerotic heart disease of native coronary artery without angina pectoris: Secondary | ICD-10-CM

## 2018-05-18 DIAGNOSIS — I10 Essential (primary) hypertension: Secondary | ICD-10-CM

## 2018-05-18 DIAGNOSIS — E785 Hyperlipidemia, unspecified: Secondary | ICD-10-CM

## 2018-05-18 MED ORDER — LOSARTAN POTASSIUM 50 MG PO TABS: 75.0000 mg | ORAL_TABLET | Freq: Every day | ORAL | 1 refills | Status: DC

## 2018-05-18 MED ORDER — ATORVASTATIN CALCIUM 80 MG PO TABS: ORAL_TABLET | ORAL | 2 refills | Status: DC

## 2018-05-18 MED ORDER — NITROGLYCERIN 0.4 MG SL SUBL: 0.4000 mg | SUBLINGUAL_TABLET | SUBLINGUAL | 5 refills | Status: AC | PRN

## 2018-05-18 MED ORDER — HYDROCHLOROTHIAZIDE 12.5 MG PO CAPS: 12.5000 mg | ORAL_CAPSULE | Freq: Every day | ORAL | 3 refills | Status: DC

## 2018-05-18 NOTE — Progress Notes (Signed)
Cardiology Office Note:    Date:  05/18/2018   ID:  Deanna Hogan, DOB 07/25/1961, MRN [id number]  PCP:  Deanna Quill, PA-C  Cardiologist:  Deanna Bollman, MD   Electrophysiologist:  None   Referring MD: Deanna Quill, PA-C   Chief Complaint  Patient presents with  . Follow-up    CAD     History of Present Illness:    Deanna Hogan is a 57 y.o. female with coronary artery disease status post anterior wall myocardial infarction in February 2014 treated with drug-eluting stent x2 to the LAD, hypertension, hyperlipidemia.  Her cardiac catheterization/PCI in February 2014 was complicated by intramural hematoma.  Initial EF was 35% but this improved to normal.  She had recurrent chest pain in August 2014 with repeat cardiac catheterization.  This demonstrated patent stents in the LAD and no significant obstructive disease elsewhere.  She was last seen by Dr. Excell Hogan in June 2018.  Deanna Hogan returns for follow up.  She is here alone.  She has not had any exertional chest pain or significant shortness of breath.  She did have an episode of chest pain last week for which she took NTG with relief.  She rarely takes NTG.  She has not had any further chest pain since.  She has not had any paroxysmal nocturnal dyspnea, leg swelling or syncope.  She does not smoke cigarettes.    Prior CV studies:   The following studies were reviewed today:  Cardiac Catheterization 04/04/13 LM ok LAD mid stents patent LCx minor plaque mid vessel RCA patent EF 55-65  Echo 12/24/12 EF 50-55, no RWMA, normal diastolic function, mild MR  Carotid US 2/14:  R < 40%   Echo 10/21/12 Mild LVH, EF 55-60, no RWMA, Gr 2 DD, trivial MR, normal RVSF  Cardiac Catheterization 10/18/12 LM ok LAD prox 90, mid 80 LCx patent RCA ok; PDA dist 70 EF 35 PCI:  2.25 x 16 mm and 2.5 x 32 mm Promus Premier DES to LAD  Past Medical History:  Diagnosis Date  . CAD (coronary artery disease)    a. Ant STEMI 10/2012 s/p  overlapped DES x 2 to prox-mid LAD, with relook cath showing intramural hematoma mid-distal LAD mg'd medically (unclear if initial mechanism of MI was plaque rupture or dissection).   . Carotid artery disease (HCC)    a. Dopp 10/2012: R <40% ICA stenosis with mild heterogeneous plaque.  [name] HLD (hyperlipidemia)   . HTN (hypertension)   . Ischemic cardiomyopathy    a. Transient - EF 35% at time of STEMI, improved to 55-60% by echo days later.   Surgical Hx: The patient  has a past surgical history that includes Coronary angioplasty with stent; Tonsillectomy; left heart catheterization with coronary angiogram (N/A, 10/18/2012); percutaneous coronary stent intervention (pci-s) (10/18/2012); left heart catheterization with coronary angiogram (N/A, 10/19/2012); and left heart catheterization with coronary angiogram (N/A, 04/04/2013).   Current Medications: Current Meds  Medication Sig  . aspirin 81 MG tablet Take 1 tablet (81 mg total) by mouth daily.  [name] atorvastatin (LIPITOR) 80 MG tablet TAKE 1 TABLET(80 MG) BY MOUTH DAILY  . fluticasone (FLONASE) 50 MCG/ACT nasal spray Place 2 sprays in each nostril once daily for allergy symptoms  . LORazepam (ATIVAN) 0.5 MG tablet TAKE 1 TABLET(0.5 MG) BY MOUTH EVERY 8 HOURS AS NEEDED FOR ANXIETY  . losartan (COZAAR) 50 MG tablet Take 1.5 tablets (75 mg total) by mouth daily. Please keep upcoming appointment for further  refills  . nitroGLYCERIN (NITROSTAT) 0.4 MG SL tablet Place 1 tablet (0.4 mg total) under the tongue every 5 (five) minutes as needed for chest pain (up to 3 doses).  . venlafaxine XR (EFFEXOR-XR) 150 MG 24 hr capsule Take 150 mg by mouth daily.  . [DISCONTINUED] atorvastatin (LIPITOR) 80 MG tablet TAKE 1 TABLET(80 MG) BY MOUTH DAILY  . [DISCONTINUED] losartan (COZAAR) 50 MG tablet Take 1.5 tablets (75 mg total) by mouth daily. Please keep upcoming appointment for further refills  . [DISCONTINUED] nitroGLYCERIN (NITROSTAT) 0.4 MG SL tablet Place 1  tablet (0.4 mg total) under the tongue every 5 (five) minutes as needed for chest pain (up to 3 doses).     Allergies:   Lisinopril; Morphine and related; and Penicillins   Social History   Tobacco Use  . Smoking status: Never Smoker  . Smokeless tobacco: Never Used  Substance Use Topics  . Alcohol use: No  . Drug use: No     Family Hx: The patient's family history includes ALS in her father; Healthy in her sister; Heart attack (age of onset: 45) in her father; Heart disease in her father and mother; Hypertension in her mother.  ROS:   Please see the history of present illness.    ROS All other systems reviewed and are negative.   EKGs/Labs/Other Test Reviewed:    EKG:  EKG is   ordered today.  The ekg ordered today demonstrates normal sinus rhythm, HR 73, normal axis, non-specific ST-TW changes, QTc 445, similar to old EKGs.   Recent Labs: No results found for requested labs within last 8760 hours.   Recent Lipid Panel Lab Results  Component Value Date/Time   CHOL 159 05/12/2014 11:42 AM   TRIG 68.0 05/12/2014 11:42 AM   HDL 46.30 05/12/2014 11:42 AM   CHOLHDL 3 05/12/2014 11:42 AM   LDLCALC 99 05/12/2014 11:42 AM   4.4.19 LDL Direct 95 <130 mg/dL  Total Cholesterol 161 25 - 199 MG/DL  Triglycerides 64 10 - 150 MG/DL  HDL Cholesterol 60 35 - 135 MG/DL  Total Chol / HDL Cholesterol 2.5 <4.5   Non-HDL Cholesterol 90  Comment:   TARGET: <(LDL-C TARGET + 30)MG/DL MG/DL   Sodium 096 045 - 409 MMOL/L  Potassium 3.8  Comment: NO VISIBLE HEMOLYSIS 3.5 - 5.3 MMOL/L  Chloride 106 98 - 110 MMOL/L  CO2 28 23 - 30 MMOL/L  BUN 7 (L) 8 - 24 MG/DL  Glucose 811 (H) 70 - 99 MG/DL  Creatinine 9.14 0.5 - 1.5 MG/DL  Calcium 9.0 8.5 - 78.2 MG/DL  Total Protein 6.4 6 - 8.3 G/DL  Albumin  4.0 3.5 - 5 G/DL  Total Bilirubin 0.7 0.1 - 1.2 MG/DL  Alkaline Phosphatase 956 25 - 125 IU/L  AST (SGOT) 20 5 - 40 IU/L  ALT (SGPT) 28 5 - 50 IU/L  Anion Gap 7 4 - 14 MMOL/L      Physical Exam:    VS:  BP 120/90   Pulse 77   Ht 5\' 6"  (1.676 m)   Wt 222 lb 6.4 oz (100.9 kg)   LMP 04/21/2013   SpO2 98%   BMI 35.90 kg/m     Wt Readings from Last 3 Encounters:  05/18/18 222 lb 6.4 oz (100.9 kg)  10/03/17 210 lb (95.3 kg)  02/16/17 210 lb 6.4 oz (95.4 kg)     Physical Exam  Constitutional: She appears well-developed and well-nourished. No distress.  HENT:  Head: Normocephalic and atraumatic.  Eyes: No scleral icterus.  Neck: No JVD present. No thyromegaly present.  Cardiovascular: Normal rate and regular rhythm.  No murmur heard. Pulmonary/Chest: Effort normal. She has no rales.  Abdominal: Soft. She exhibits no distension.  Musculoskeletal: She exhibits no edema.  Lymphadenopathy:    She has no cervical adenopathy.  Neurological: She is alert.  Skin: Skin is warm and dry.  Psychiatric: She has a normal mood and affect.    ASSESSMENT & PLAN:    Coronary artery disease involving native coronary artery of native heart without angina pectoris Hx of anterior MI in 2014 treated with DES x 2 to the LAD.  Cardiac Catheterization in 04/2013 demonstrated patent stents and no significant CAD elsewhere.  She is interested in starting an exercise program.  She had one episode of chest pain recently that was responsive to NTG.  She has not had any further chest pain since.  Her ECG does not show any acute changes.  I have recommended that she undergo a plain exercise stress test before starting an exercise program.   -Arrange Exercise Tolerance Test  -Continue ASA, statin, angiotensin receptor blocker.  -Follow up in 1 year  Essential hypertension  Diastolic blood pressure is above target and has been for a while now.  We discussed monitoring her blood pressure and adjusting her medications if it remains above goal vs adjusting her medications now.  She prefers to adjust her medications now.   -Continue Losartan 75 mg QD  -Start HCTZ 12.5 mg QD  -BMET 2  weeks.  Hyperlipidemia, unspecified hyperlipidemia type   Goal LDL < 70.  Recent LDL in 4/19 was 95.  Arrange repeat Lipids and LFTs.  If her LDL remains > 70, I will change Atorvastatin to Rosuvastatin.   Dispo:  Return in about 1 year (around 05/19/2019) for Routine Follow Up, w/ Dr. Excell Seltzerooper, or Tereso NewcomerScott Bettylee Feig, PA-C.   Medication Adjustments/Labs and Tests Ordered: Current medicines are reviewed at length with the patient today.  Concerns regarding medicines are outlined above.  Tests Ordered: Orders Placed This Encounter  Procedures  . Basic metabolic panel  . Lipid Profile  . Hepatic function panel  . Exercise Tolerance Test   Medication Changes: Meds ordered this encounter  Medications  . hydrochlorothiazide (MICROZIDE) 12.5 MG capsule    Sig: Take 1 capsule (12.5 mg total) by mouth daily.    Dispense:  90 capsule    Refill:  3  . atorvastatin (LIPITOR) 80 MG tablet    Sig: TAKE 1 TABLET(80 MG) BY MOUTH DAILY    Dispense:  90 tablet    Refill:  2  . losartan (COZAAR) 50 MG tablet    Sig: Take 1.5 tablets (75 mg total) by mouth daily. Please keep upcoming appointment for further refills    Dispense:  45 tablet    Refill:  1  . nitroGLYCERIN (NITROSTAT) 0.4 MG SL tablet    Sig: Place 1 tablet (0.4 mg total) under the tongue every 5 (five) minutes as needed for chest pain (up to 3 doses).    Dispense:  25 tablet    Refill:  5    Signed, Tereso NewcomerScott Cannen Dupras, PA-C  05/18/2018 4:27 PM    [facility] [address], [place], [place]  [address] Phone: [phone]; Fax: [phone]

## 2018-05-18 NOTE — Patient Instructions (Addendum)
Medication Instructions:  Start: Hydrochlorothiazide 12.5 mg daily, by mouth  Medications refilled on paper perscription  Labwork: Future: Schedule same day as exercise stress test: BMET, Lipid and LFT  Testing/Procedures: Your physician has requested that you have an exercise tolerance test. For further information please visit [email or web address] Please also follow instruction sheet, as given.  Follow-Up: Your physician wants you to follow-up in: 1 year with Dr. Excell Seltzerooper. You will receive a reminder letter in the mail two months in advance. If you don't receive a letter, please call our office to schedule the follow-up appointment.  If you need a refill on your cardiac medications before your next appointment, please call your pharmacy.

## 2018-05-19 NOTE — Addendum Note (Signed)
Addended by: Vernard GamblesOLE, Waris Rodger S on: 05/19/2018 10:52 AM   Modules accepted: Orders

## 2018-06-02 ENCOUNTER — Other Ambulatory Visit: Payer: Self-pay

## 2018-07-01 ENCOUNTER — Encounter: Payer: Self-pay | Admitting: Physician Assistant

## 2018-07-26 IMAGING — DX DG CHEST 2V
2 series · 2 of 2 positions shown · non-contrast
Comparison: 04/02/2013, 10/18/2012.

CLINICAL DATA: One month history of productive cough. Personal
history of coronary artery disease with stenting.

EXAM:
CHEST  2 VIEW

[chest pa]
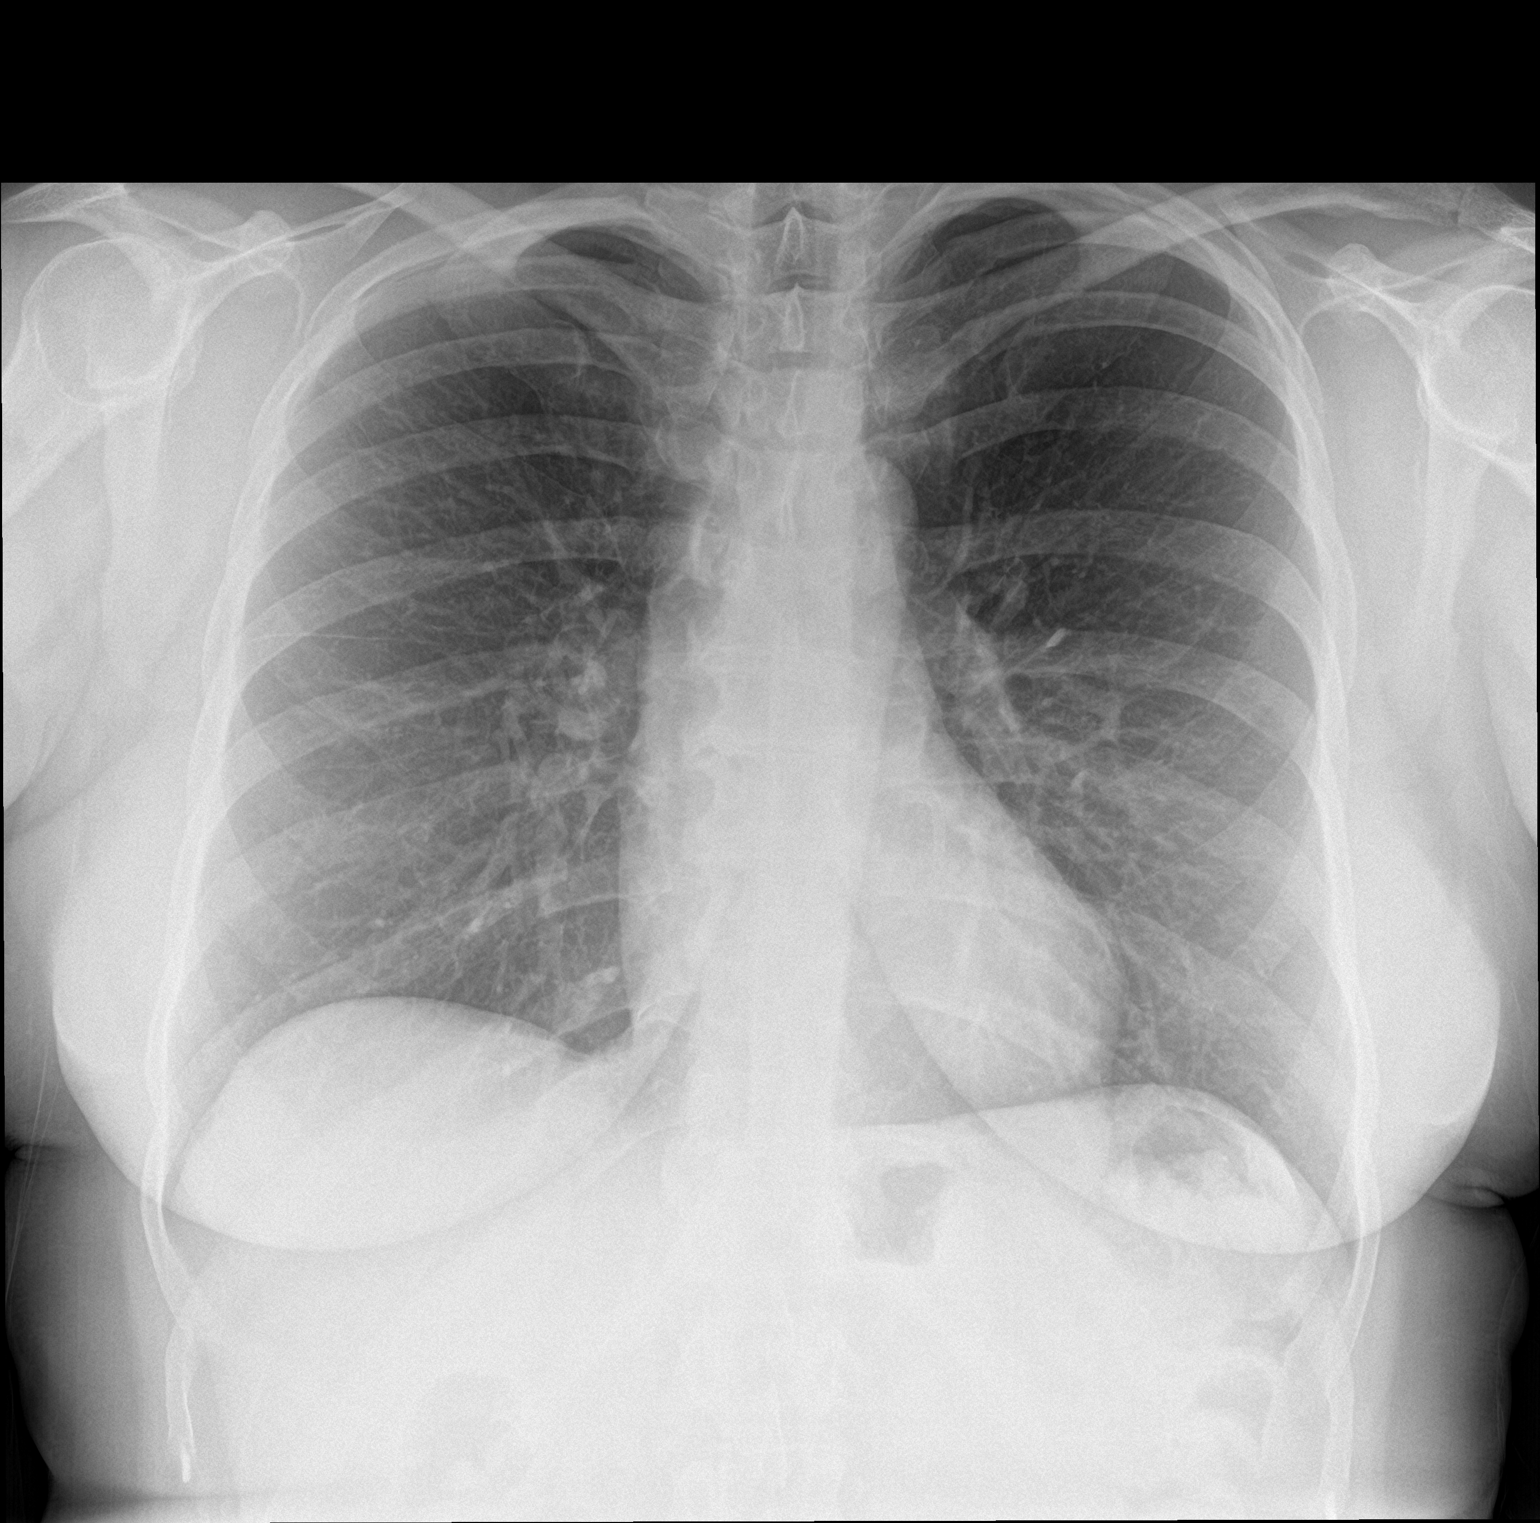

[chest lat]
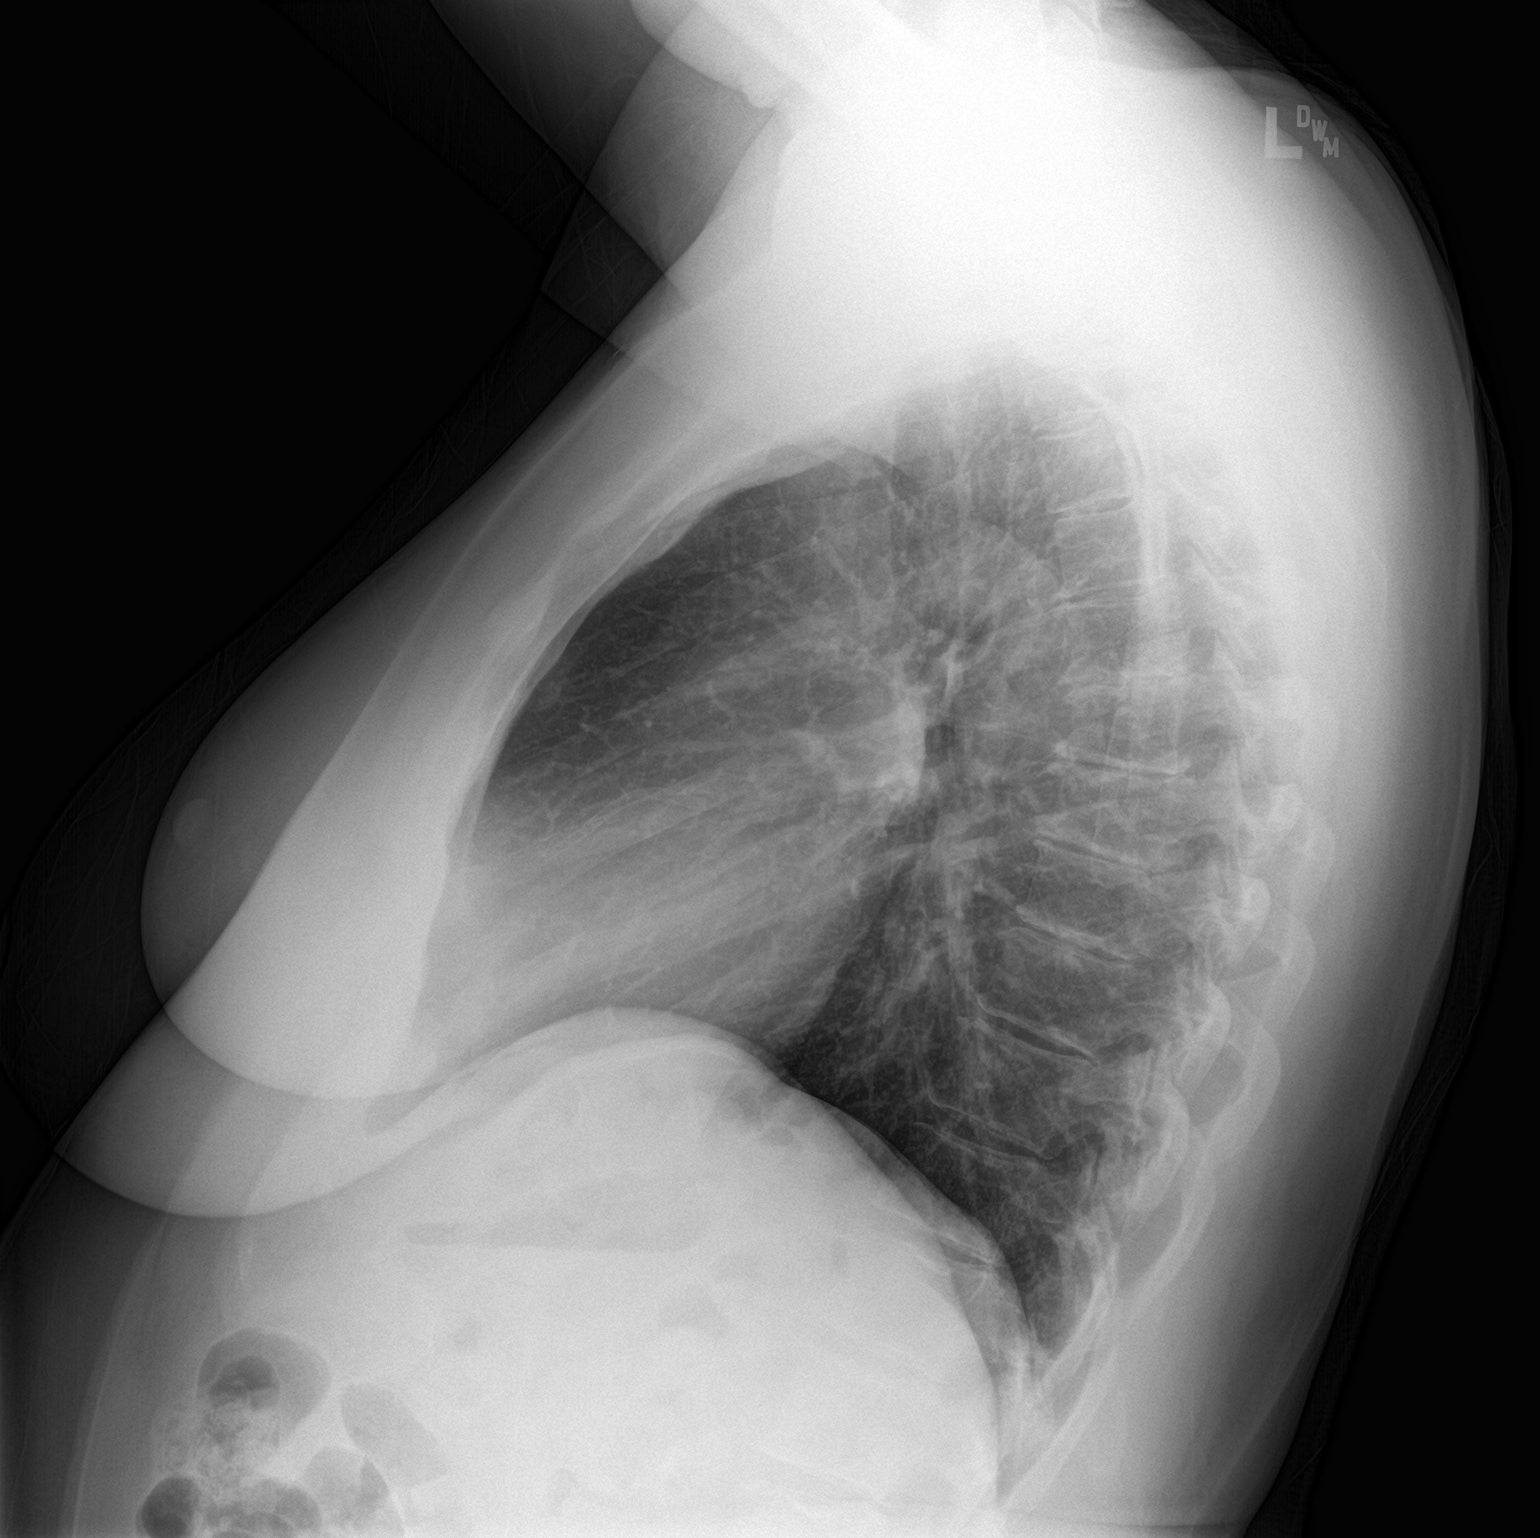

[2 of 2 positions shown; findings below may reference images not displayed]

FINDINGS: Cardiomediastinal silhouette unremarkable, unchanged. Mild central
peribronchial thickening, more so than on the prior examination.
Lungs otherwise clear. No localized airspace consolidation. No
pleural effusions. No pneumothorax. Normal pulmonary vascularity.
Degenerative changes involving the thoracic and upper lumbar spine.
IMPRESSION: Mild changes of acute bronchitis and/or asthma without focal
airspace pneumonia.

## 2019-05-20 ENCOUNTER — Other Ambulatory Visit: Payer: Self-pay | Admitting: Physician Assistant

## 2019-06-15 ENCOUNTER — Other Ambulatory Visit: Payer: Self-pay | Admitting: Physician Assistant

## 2019-06-23 ENCOUNTER — Other Ambulatory Visit: Payer: Self-pay | Admitting: Physician Assistant

## 2019-07-20 ENCOUNTER — Other Ambulatory Visit: Payer: Self-pay | Admitting: Physician Assistant

## 2019-08-04 ENCOUNTER — Other Ambulatory Visit: Payer: Self-pay | Admitting: Physician Assistant

## 2019-09-05 NOTE — Progress Notes (Signed)
Cardiology Office Note    Date:  09/06/2019   ID:  Deanna Hogan, Deanna Hogan 1961-07-18, MRN [id number]  PCP:  Fransisca Connors, PA-C  Cardiologist: Dr. Burt Knack   Chief Complaint: 12 month follow up   History of Present Illness:   Deanna Hogan is a 59 y.o. female with history of CAD, hypertension, hyperlipidemia, ischemic cardiomyopathy with improved LV function and mild carotid plaque seen for follow-up.  Presented February 2014 with anterior MI.  She was treated with drug-eluting stents x2 to LAD.  Hospital course complicated by intramural hematoma.  Initial LV function was 35% but improved to normal.  Repeat cardiac cath August 2014 showed patent stent without significant obstructive disease.  Here today for yearly follow-up. For the past 2 days patient has dull achy substernal chest pressure at intensity of 3-4/10.  She has pain between shoulder blade but unsure if this radiation or not.  Nothing makes it better or worse.  She has not tried sublingual nitroglycerin.  Her symptoms similar when she had MI in 2014 but much less intensity.  She denies shortness of breath, nausea or left shoulder radiation.  Husband recently diagnosed with cancer.  She denies orthopnea, PND, syncope or lower extremity edema.   Past Medical History:  Diagnosis Date  . CAD (coronary artery disease)    a. Ant STEMI 10/2012 s/p overlapped DES x 2 to prox-mid LAD, with relook cath showing intramural hematoma mid-distal LAD mg'd medically (unclear if initial mechanism of MI was plaque rupture or dissection).   . Carotid artery disease ([place])    a. Dopp 10/2012: R <40% ICA stenosis with mild heterogeneous plaque.  [name] HLD (hyperlipidemia)   . HTN (hypertension)   . Ischemic cardiomyopathy    a. Transient - EF 35% at time of STEMI, improved to 55-60% by echo days later.    Past Surgical History:  Procedure Laterality Date  . CORONARY ANGIOPLASTY WITH STENT PLACEMENT    . LEFT HEART CATHETERIZATION WITH CORONARY  ANGIOGRAM N/A 10/18/2012   Procedure: LEFT HEART CATHETERIZATION WITH CORONARY ANGIOGRAM;  Surgeon: Sherren Mocha, MD;  Location[facility] CATH LAB;  Service: Cardiovascular;  Laterality: N/A;  . LEFT HEART CATHETERIZATION WITH CORONARY ANGIOGRAM N/A 10/19/2012   Procedure: LEFT HEART CATHETERIZATION WITH CORONARY ANGIOGRAM;  Surgeon: Peter M Martinique, MD;  Location: [facility] CATH LAB;  Service: Cardiovascular;  Laterality: N/A;  . LEFT HEART CATHETERIZATION WITH CORONARY ANGIOGRAM N/A 04/04/2013   Procedure: LEFT HEART CATHETERIZATION WITH CORONARY ANGIOGRAM;  Surgeon: Sherren Mocha, MD;  Location: [facility] CATH LAB;  Service: Cardiovascular;  Laterality: N/A;  . PERCUTANEOUS CORONARY STENT INTERVENTION (PCI-S)  10/18/2012   Procedure: PERCUTANEOUS CORONARY STENT INTERVENTION (PCI-S);  Surgeon: Sherren Mocha, MD;  Location: [facility] CATH LAB;  Service: Cardiovascular;;  . TONSILLECTOMY      Current Medications: Prior to Admission medications   Medication Sig Start Date End Date Taking? Authorizing Provider  aspirin 81 MG tablet Take 1 tablet (81 mg total) by mouth daily. 10/22/12   Dunn, Nedra Hai, PA-C  atorvastatin (LIPITOR) 80 MG tablet TAKE ONE TABLET BY MOUTH DAILY. Please make overdue appt with Dr. Burt Knack before anymore refills. 2nd attempt 07/21/19   Richardson Dopp T, PA-C  clopidogrel (PLAVIX) 75 MG tablet Take 75 mg by mouth daily.    [provider]  fluticasone Asencion Islam) 50 MCG/ACT nasal spray Place 2 sprays in each nostril once daily for allergy symptoms 04/17/14   Kandra Nicolas, MD  hydrochlorothiazide (MICROZIDE) 12.5 MG capsule TAKE  ONE CAPSULE BY MOUTH DAILY 05/20/19   Tereso NewcomerWeaver, Scott T, PA-C  LORazepam (ATIVAN) 0.5 MG tablet TAKE 1 TABLET(0.5 MG) BY MOUTH EVERY 8 HOURS AS NEEDED FOR ANXIETY 10/09/16   [provider]  losartan (COZAAR) 25 MG tablet TAKE 3 TABLETS BY MOUTH DAILY 06/16/19   Tereso NewcomerWeaver, Scott T, PA-C  losartan (COZAAR) 50 MG tablet Take 1.5 tablets (75 mg total) by mouth daily. Please  keep upcoming appointment for further refills 05/18/18   Tereso NewcomerWeaver, Scott T, PA-C  nitroGLYCERIN (NITROSTAT) 0.4 MG SL tablet Place 1 tablet (0.4 mg total) under the tongue every 5 (five) minutes as needed for chest pain (up to 3 doses). 05/18/18   Tereso NewcomerWeaver, Scott T, PA-C  venlafaxine XR (EFFEXOR-XR) 150 MG 24 hr capsule Take 150 mg by mouth daily. 02/01/17   [provider]    Allergies:   Lisinopril, Morphine and related, and Penicillins   Social History   Socioeconomic History  . Marital status: Married    Spouse name: Not on file  . Number of children: Not on file  . Years of education: Not on file  . Highest education level: Not on file  Occupational History  . Not on file  Tobacco Use  . Smoking status: Never Smoker  . Smokeless tobacco: Never Used  Substance and Sexual Activity  . Alcohol use: No  . Drug use: No  . Sexual activity: Not on file  Other Topics Concern  . Not on file  Social History Narrative  . Not on file   Social Determinants of Health   Financial Resource Strain:   . Difficulty of Paying Living Expenses: Not on file  Food Insecurity:   . Worried About Programme researcher, broadcasting/film/videounning Out of Food in the Last Year: Not on file  . Ran Out of Food in the Last Year: Not on file  Transportation Needs:   . Lack of Transportation (Medical): Not on file  . Lack of Transportation (Non-Medical): Not on file  Physical Activity:   . Days of Exercise per Week: Not on file  . Minutes of Exercise per Session: Not on file  Stress:   . Feeling of Stress : Not on file  Social Connections:   . Frequency of Communication with Friends and Family: Not on file  . Frequency of Social Gatherings with Friends and Family: Not on file  . Attends Religious Services: Not on file  . Active Member of Clubs or Organizations: Not on file  . Attends BankerClub or Organization Meetings: Not on file  . Marital Status: Not on file     Family History:  The patient's family history includes ALS in her father;  Healthy in her sister; Heart attack (age of onset: 8650) in her father; Heart disease in her father and mother; Hypertension in her mother.   ROS:   Please see the history of present illness.    ROS All other systems reviewed and are negative.   PHYSICAL EXAM:   VS:  BP 122/90   Pulse 73   Ht 5\' 6"  (1.676 m)   Wt 220 lb 12.8 oz (100.2 kg)   LMP 04/21/2013   SpO2 99%   BMI 35.64 kg/m    GEN: Well nourished, well developed, in no acute distress  HEENT: normal  Neck: no JVD, carotid bruits, or masses Cardiac: RRR; no murmurs, rubs, or gallops,no edema  Respiratory:  clear to auscultation bilaterally, normal work of breathing GI: soft, nontender, nondistended, + BS MS: no deformity or atrophy  Skin: warm and dry, no rash Neuro:  Alert and Oriented x 3, Strength and sensation are intact Psych: euthymic mood, full affect  Wt Readings from Last 3 Encounters:  09/06/19 220 lb 12.8 oz (100.2 kg)  05/18/18 222 lb 6.4 oz (100.9 kg)  10/03/17 210 lb (95.3 kg)      Studies/Labs Reviewed:   EKG:  EKG is ordered today.  The ekg ordered today demonstrates normal sinus rhythm at rate of 73 bpm, nonspecific T wave inversion laterally which is similar to prior EKG.  Recent Labs: No results found for requested labs within last 8760 hours.   Lipid Panel    Component Value Date/Time   CHOL 159 05/12/2014 1142   TRIG 68.0 05/12/2014 1142   HDL 46.30 05/12/2014 1142   CHOLHDL 3 05/12/2014 1142   VLDL 13.6 05/12/2014 1142   LDLCALC 99 05/12/2014 1142    Additional studies/ records that were reviewed today include:   As summarized above   ASSESSMENT & PLAN:    1. Chest pain with hx of CAD s/p LAD stenting x2 Patient reports symptoms similar to prior MI but much less intensity.  Her pain between shoulder blade is reproducible with palpation but not chest pressure.  Given nitroglycerin x1 during office visit without improvement on her symptoms.  EKG without acute changes.  Will get  troponin check.  If abnormal go to ER versus outpatient cath.  Will schedule for stress test otherwise.  Start Imdur.  2.  Hypertension - BP stable and well controlled on current medications.   3.  Hyperlipidemia - Continue statin. Recently had lab wok for life insurance. She will send [place] via Mychart for review.    Medication Adjustments/Labs and Tests Ordered: Current medicines are reviewed at length with the patient today.  Concerns regarding medicines are outlined above.  Medication changes, Labs and Tests ordered today are listed in the Patient Instructions below. Patient Instructions  Medication Instructions:  .Your physician has recommended you make the following change in your medication:  1.  START Imdur 30 mg daily  *If you need a refill on your cardiac medications before your next appointment, please call your pharmacy*  Lab Work: TODAY:  TROPONIN T STAT If you have labs (blood work) drawn today and your tests are completely normal, you will receive your results only by: [name] MyChart Message (if you have MyChart) OR . A paper copy in the mail If you have any lab test that is abnormal or we need to change your treatment, we will call you to review the results.  Testing/Procedures: Your physician has requested that you have a lexiscan myoview. For further information please visit [email or web address] Please follow instruction sheet, as given.    Follow-Up: At [facility], you and your health needs are our priority.  As part of our continuing mission to provide you with exceptional heart care, we have created designated Provider Care Teams.  These Care Teams include your primary Cardiologist (physician) and Advanced Practice Providers (APPs -  Physician Assistants and Nurse Practitioners) who all work together to provide you with the care you need, when you need it.  Your next appointment:   1-2  week(s) AFTER STRESS TEST   The format for your next appointment:   Virtual  Visit   Provider:   Chelsea Aus, PA-C  Other Instructions  Nuclear Medicine Exam A nuclear medicine exam is a safe and painless imaging test. It helps your health care provider detect and diagnose diseases. It  also provides information about the ways your organs work and how they are structured. For a nuclear medicine exam, you will be given a radioactive tracer. This substance is absorbed by your body's organs. A large scanning machine detects the tracer and creates pictures of the areas that your health care provider wants to know more about. There are several kinds of nuclear medicine exams. They include the following:  CT scan.  MRI scan.  PET scan.  SPECT scan. Tell your health care provider about:  Any allergies you have.  All medicines you are taking, including vitamins, herbs, eye drops, creams, and over-the-counter medicines.  Any problems you or family members have had with anesthetic medicines.  Any blood disorders you have.  Any surgeries you have had.  Any medical conditions you have.  Whether you are pregnant or may be pregnant.  Whether you are nursing. What are the risks? Generally, this is a safe procedure. However, problems may occur, such as an allergic reaction to the tracer, but this is rare. What happens before the procedure? Medicines Ask your health care provider about:  Changing or stopping your regular medicines. This is especially important if you are taking diabetes medicines or blood thinners.  Taking medicines such as aspirin and ibuprofen. These medicines can thin your blood. Do not take these medicines unless your health care provider tells you to take them.  Taking over-the-counter medicines, vitamins, herbs, and supplements. General instructions  Follow instructions from your health care provider about eating or drinking restrictions.  Do not wear jewelry.  Wear loose, comfortable clothing. You may be asked to wear a hospital gown  for the procedure.  Bring previous imaging studies, such as X-rays, with you to the exam if they are available. What happens during the procedure?   An IV may be inserted into one of your veins.  You will be asked to lie on a table or sit in a chair.  You will be given the radioactive tracer. You may get: ? A pill or liquid to swallow. ? An injection. ? Medicine through your IV. ? A gas to inhale.  A large scanning machine will be used to create images of your body. After the pictures are taken, you may have to wait so your health care provider can make sure that enough images were taken. The procedure may vary among health care providers and hospitals. What happens after the procedure?  You may go home after the procedure and return to your usual activities, unless your health care provider tells you otherwise.  Drink enough water to keep your urine pale yellow. This helps to remove the radioactive tracer from your body.  It is up to you to get the results of your procedure. Ask your health care provider, or the department that is doing the procedure, when your results will be ready.  Get help right away if you have problems breathing. Summary  A nuclear medicine exam is a safe and painless imaging test that provides information about how your organs are working. It is also used to detect and diagnose diseases of various body organs.  Follow your health care provider's instructions about eating and drinking restrictions. Ask whether you should change or stop any medicines.  During the procedure, you will be given a radioactive tracer. A large scanning machine will create images of your body.  You may go home after the procedure and return to your regular activities. Follow your health care provider's instructions.  Get help  right away if you have problems breathing. This information is not intended to replace advice given to you by your health care provider. Make sure you  discuss any questions you have with your health care provider. Document Revised: 07/07/2018 Document Reviewed: 07/07/2018 Elsevier Patient Education  [address]      Vonzella Nipple [place], [place]  09/06/2019 4:31 PM    [facility] [address] [place], [place], [place]  [address] Phone: [phone]; Fax: [phone]

## 2019-09-06 ENCOUNTER — Encounter: Payer: Self-pay | Admitting: *Deleted

## 2019-09-06 ENCOUNTER — Ambulatory Visit (INDEPENDENT_AMBULATORY_CARE_PROVIDER_SITE_OTHER): Payer: Self-pay | Admitting: Physician Assistant

## 2019-09-06 ENCOUNTER — Other Ambulatory Visit: Payer: Self-pay

## 2019-09-06 ENCOUNTER — Encounter: Payer: Self-pay | Admitting: Physician Assistant

## 2019-09-06 VITALS — BP 122/90 | HR 73 | Ht 66.0 in | Wt 220.8 lb

## 2019-09-06 DIAGNOSIS — I10 Essential (primary) hypertension: Secondary | ICD-10-CM

## 2019-09-06 DIAGNOSIS — R079 Chest pain, unspecified: Secondary | ICD-10-CM

## 2019-09-06 DIAGNOSIS — E785 Hyperlipidemia, unspecified: Secondary | ICD-10-CM

## 2019-09-06 DIAGNOSIS — I25110 Atherosclerotic heart disease of native coronary artery with unstable angina pectoris: Secondary | ICD-10-CM

## 2019-09-06 LAB — TROPONIN T: Troponin T TROPT: <0.011 ng/mL (ref <0.011)

## 2019-09-06 MED ORDER — NITROGLYCERIN 0.4 MG SL SUBL
0.4000 mg | SUBLINGUAL_TABLET | SUBLINGUAL | Status: AC | PRN
  Administered 2019-09-06: 0.4 mg via SUBLINGUAL

## 2019-09-06 NOTE — Patient Instructions (Addendum)
Medication Instructions:  .Your physician has recommended you make the following change in your medication:  1.  START Imdur 30 mg daily  *If you need a refill on your cardiac medications before your next appointment, please call your pharmacy*  Lab Work: TODAY:  TROPONIN T STAT If you have labs (blood work) drawn today and your tests are completely normal, you will receive your results only by: Deanna Hogan MyChart Message (if you have MyChart) OR . A paper copy in the mail If you have any lab test that is abnormal or we need to change your treatment, we will call you to review the results.  Testing/Procedures: Your physician has requested that you have a lexiscan myoview. For further information please visit [email or web address]. Please follow instruction sheet, as given.    Follow-Up: At [facility], you and your health needs are our priority.  As part of our continuing mission to provide you with exceptional heart care, we have created designated Provider Care Teams.  These Care Teams include your primary Cardiologist (physician) and Advanced Practice Providers (APPs -  Physician Assistants and Nurse Practitioners) who all work together to provide you with the care you need, when you need it.  Your next appointment:   1-2  week(s) AFTER STRESS TEST   The format for your next appointment:   Virtual Visit   Provider:   Robbie Lis, PA-C  Other Instructions  Nuclear Medicine Exam A nuclear medicine exam is a safe and painless imaging test. It helps your health care provider detect and diagnose diseases. It also provides information about the ways your organs work and how they are structured. For a nuclear medicine exam, you will be given a radioactive tracer. This substance is absorbed by your body's organs. A large scanning machine detects the tracer and creates pictures of the areas that your health care provider wants to know more about. There are several kinds of nuclear medicine exams.  They include the following:  CT scan.  MRI scan.  PET scan.  SPECT scan. Tell your health care provider about:  Any allergies you have.  All medicines you are taking, including vitamins, herbs, eye drops, creams, and over-the-counter medicines.  Any problems you or family members have had with anesthetic medicines.  Any blood disorders you have.  Any surgeries you have had.  Any medical conditions you have.  Whether you are pregnant or may be pregnant.  Whether you are nursing. What are the risks? Generally, this is a safe procedure. However, problems may occur, such as an allergic reaction to the tracer, but this is rare. What happens before the procedure? Medicines Ask your health care provider about:  Changing or stopping your regular medicines. This is especially important if you are taking diabetes medicines or blood thinners.  Taking medicines such as aspirin and ibuprofen. These medicines can thin your blood. Do not take these medicines unless your health care provider tells you to take them.  Taking over-the-counter medicines, vitamins, herbs, and supplements. General instructions  Follow instructions from your health care provider about eating or drinking restrictions.  Do not wear jewelry.  Wear loose, comfortable clothing. You may be asked to wear a hospital gown for the procedure.  Bring previous imaging studies, such as X-rays, with you to the exam if they are available. What happens during the procedure?   An IV may be inserted into one of your veins.  You will be asked to lie on a table or sit in a chair.  You will be given the radioactive tracer. You may get: ? A pill or liquid to swallow. ? An injection. ? Medicine through your IV. ? A gas to inhale.  A large scanning machine will be used to create images of your body. After the pictures are taken, you may have to wait so your health care provider can make sure that enough images were taken.  The procedure may vary among health care providers and hospitals. What happens after the procedure?  You may go home after the procedure and return to your usual activities, unless your health care provider tells you otherwise.  Drink enough water to keep your urine pale yellow. This helps to remove the radioactive tracer from your body.  It is up to you to get the results of your procedure. Ask your health care provider, or the department that is doing the procedure, when your results will be ready.  Get help right away if you have problems breathing. Summary  A nuclear medicine exam is a safe and painless imaging test that provides information about how your organs are working. It is also used to detect and diagnose diseases of various body organs.  Follow your health care provider's instructions about eating and drinking restrictions. Ask whether you should change or stop any medicines.  During the procedure, you will be given a radioactive tracer. A large scanning machine will create images of your body.  You may go home after the procedure and return to your regular activities. Follow your health care provider's instructions.  Get help right away if you have problems breathing. This information is not intended to replace advice given to you by your health care provider. Make sure you discuss any questions you have with your health care provider. Document Revised: 07/07/2018 Document Reviewed: 07/07/2018 Elsevier Patient Education  2020 [employer].

## 2019-09-07 ENCOUNTER — Telehealth (HOSPITAL_COMMUNITY): Payer: Self-pay

## 2019-09-07 ENCOUNTER — Telehealth: Payer: Self-pay

## 2019-09-07 NOTE — Telephone Encounter (Signed)
-----   Message from [place], [place] sent at 09/07/2019  8:25 AM EST ----- Normal troponin level.

## 2019-09-07 NOTE — Telephone Encounter (Signed)
The patient has been notified of the result and verbalized understanding.  All questions (if any) were answered. Sigurd Sos, RN 09/07/2019 8:28 AM

## 2019-09-07 NOTE — Telephone Encounter (Signed)
Spoke  With the patient, instructions given. Pt stated that she would be here for here test. Asked to call back with any questions. Norris Cross EMTP

## 2019-09-13 ENCOUNTER — Encounter (HOSPITAL_COMMUNITY): Payer: Self-pay

## 2019-09-13 ENCOUNTER — Encounter (HOSPITAL_COMMUNITY): Payer: Medicaid Other

## 2019-09-13 ENCOUNTER — Telehealth (HOSPITAL_COMMUNITY): Payer: Self-pay | Admitting: *Deleted

## 2019-09-13 NOTE — Telephone Encounter (Signed)
Patient was a no show for her Nuclear Stress test. Called patient and she does not wish to R/S at this time.  Ricky Ala RN

## 2019-09-20 NOTE — Progress Notes (Signed)
Virtual Visit via Telephone Note   This visit type was conducted due to national recommendations for restrictions regarding the COVID-19 Pandemic (e.g. social distancing) in an effort to limit this patient's exposure and mitigate transmission in our community.  Due to her co-morbid illnesses, this patient is at least at moderate risk for complications without adequate follow up.  This format is felt to be most appropriate for this patient at this time.  The patient did not have access to video technology/had technical difficulties with video requiring transitioning to audio format only (telephone).  All issues noted in this document were discussed and addressed.  No physical exam could be performed with this format.  Please refer to the patient's chart for her  consent to telehealth for [facility].   Date:  09/21/2019   ID:  Deanna Hogan, DOB 03-Nov-1960, MRN [id number]  Patient Location: Home Provider Location: Home  PCP:  [name], PA-C  Cardiologist:  Tonny Bollman, MD  Electrophysiologist:  None   Evaluation Performed:  Follow-Up Visit  Chief Complaint:  2 weeks follow up   History of Present Illness:    Deanna Hogan is a 59 y.o. female with history of CAD, hypertension, hyperlipidemia, ischemic cardiomyopathy with improved LV function and mild carotid plaque seen for follow-up.  Presented February 2014 with anterior MI.  She was treated with drug-eluting stents x2 to LAD.  Hospital course complicated by intramural hematoma.  Initial LV function was 35% but improved to normal.  Repeat cardiac cath August 2014 showed patent stent without significant obstructive disease.  Seen by me 09/06/19 for yearly follow up. She had dull achy substernal chest pressure at intensity of 3-4/10.  She has pain between shoulder blade but unsure if this radiation or not.  Patient reports symptoms similar to prior MI but much less intensity.  Her pain between shoulder blade is reproducible  with palpation but not chest pressure. Given nitroglycerin x1 during office visit without improvement on her symptoms. EKG and troponin reassuring. Started Imdur. Did not showed up for stress test.   Seen for follow-up.  Denies any current symptoms since last office visit.  She thinks it was due to walking greater than 100 pound dog " I might have pulled muscle".  She has not started Imdur.  She denies chest pain, shortness of breath, orthopnea, PND, syncope or lower extremity edema.  The patient does not have symptoms concerning for COVID-19 infection (fever, chills, cough, or new shortness of breath).    Past Medical History:  Diagnosis Date  . CAD (coronary artery disease)    a. Ant STEMI 10/2012 s/p overlapped DES x 2 to prox-mid LAD, with relook cath showing intramural hematoma mid-distal LAD mg'd medically (unclear if initial mechanism of MI was plaque rupture or dissection).   . Carotid artery disease (HCC)    a. Dopp 10/2012: R <40% ICA stenosis with mild heterogeneous plaque.  [name] HLD (hyperlipidemia)   . HTN (hypertension)   . Ischemic cardiomyopathy    a. Transient - EF 35% at time of STEMI, improved to 55-60% by echo days later.   Past Surgical History:  Procedure Laterality Date  . CORONARY ANGIOPLASTY WITH STENT PLACEMENT    . LEFT HEART CATHETERIZATION WITH CORONARY ANGIOGRAM N/A 10/18/2012   Procedure: LEFT HEART CATHETERIZATION WITH CORONARY ANGIOGRAM;  Surgeon: Tonny Bollman, MD;  Location: [facility] CATH LAB;  Service: Cardiovascular;  Laterality: N/A;  . LEFT HEART CATHETERIZATION WITH CORONARY ANGIOGRAM N/A 10/19/2012   Procedure:  LEFT HEART CATHETERIZATION WITH CORONARY ANGIOGRAM;  Surgeon: Peter M Swaziland, MD;  Location: [facility] CATH LAB;  Service: Cardiovascular;  Laterality: N/A;  . LEFT HEART CATHETERIZATION WITH CORONARY ANGIOGRAM N/A 04/04/2013   Procedure: LEFT HEART CATHETERIZATION WITH CORONARY ANGIOGRAM;  Surgeon: Tonny Bollman, MD;  Location: [facility] CATH LAB;  Service:  Cardiovascular;  Laterality: N/A;  . PERCUTANEOUS CORONARY STENT INTERVENTION (PCI-S)  10/18/2012   Procedure: PERCUTANEOUS CORONARY STENT INTERVENTION (PCI-S);  Surgeon: Tonny Bollman, MD;  Location: [facility] CATH LAB;  Service: Cardiovascular;;  . TONSILLECTOMY       Current Meds  Medication Sig  . aspirin 81 MG tablet Take 1 tablet (81 mg total) by mouth daily.  [name] atorvastatin (LIPITOR) 80 MG tablet TAKE ONE TABLET BY MOUTH DAILY. Please make overdue appt with Dr. Excell Seltzer before anymore refills. 2nd attempt  . clopidogrel (PLAVIX) 75 MG tablet Take 75 mg by mouth daily.  . hydrochlorothiazide (MICROZIDE) 12.5 MG capsule TAKE ONE CAPSULE BY MOUTH DAILY  . LORazepam (ATIVAN) 0.5 MG tablet TAKE 1 TABLET(0.5 MG) BY MOUTH EVERY 8 HOURS AS NEEDED FOR ANXIETY  . losartan (COZAAR) 25 MG tablet TAKE 3 TABLETS BY MOUTH DAILY  . nitroGLYCERIN (NITROSTAT) 0.4 MG SL tablet Place 1 tablet (0.4 mg total) under the tongue every 5 (five) minutes as needed for chest pain (up to 3 doses).  . venlafaxine XR (EFFEXOR-XR) 150 MG 24 hr capsule Take 150 mg by mouth daily.  . [DISCONTINUED] losartan (COZAAR) 50 MG tablet Take 1.5 tablets (75 mg total) by mouth daily. Please keep upcoming appointment for further refills   Current Facility-Administered Medications for the 09/21/19 encounter (Telemedicine) with Manson Passey, PA  Medication  . nitroGLYCERIN (NITROSTAT) SL tablet 0.4 mg     Allergies:   Lisinopril, Morphine and related, and Penicillins   Social History   Tobacco Use  . Smoking status: Never Smoker  . Smokeless tobacco: Never Used  Substance Use Topics  . Alcohol use: No  . Drug use: No     Family Hx: The patient's family history includes ALS in her father; Healthy in her sister; Heart attack (age of onset: 41) in her father; Heart disease in her father and mother; Hypertension in her mother.  ROS:   Please see the history of present illness.    All other systems reviewed and are  negative.   Prior CV studies:   The following studies were reviewed today:  As summarized above  Labs/Other Tests and Data Reviewed:    EKG:  No ECG reviewed.  Recent Labs: No results found for requested labs within last 8760 hours.   Recent Lipid Panel Lab Results  Component Value Date/Time   CHOL 159 05/12/2014 11:42 AM   TRIG 68.0 05/12/2014 11:42 AM   HDL 46.30 05/12/2014 11:42 AM   CHOLHDL 3 05/12/2014 11:42 AM   LDLCALC 99 05/12/2014 11:42 AM    Wt Readings from Last 3 Encounters:  09/21/19 220 lb 12.8 oz (100.2 kg)  09/06/19 220 lb 12.8 oz (100.2 kg)  05/18/18 222 lb 6.4 oz (100.9 kg)     Objective:    Vital Signs:  Ht 5\' 6"  (1.676 m)   Wt 220 lb 12.8 oz (100.2 kg)   LMP 04/21/2013   BMI 35.64 kg/m    VITAL SIGNS:  reviewed GEN:  no acute distress PSYCH:  normal affect  ASSESSMENT & PLAN:    1. CAD Symptoms resolved spontaneously.  Likely musculoskeletal in etiology.  Will cancel stress test and  Imdur.  She will try sublingual nitroglycerin with recurrent episode.  Continue current medical therapy.  2. HTN Unable to check her blood pressure.  Continue losartan and hydrochlorothiazide.  3. HLD -Continue statin.   COVID-19 Education: The signs and symptoms of COVID-19 were discussed with the patient and how to seek care for testing (follow up with PCP or arrange E-visit).  The importance of social distancing was discussed today.  Time:   Today, I have spent 5 minutes with the patient with telehealth technology discussing the above problems.     Medication Adjustments/Labs and Tests Ordered: Current medicines are reviewed at length with the patient today.  Concerns regarding medicines are outlined above.   Tests Ordered: No orders of the defined types were placed in this encounter.   Medication Changes: No orders of the defined types were placed in this encounter.   Follow Up:  Either In Person or Virtual in 1  year(s)  Signed, Leanor Kail, PA  09/21/2019 1:38 PM    [facility]

## 2019-09-21 ENCOUNTER — Telehealth (INDEPENDENT_AMBULATORY_CARE_PROVIDER_SITE_OTHER): Payer: Self-pay | Admitting: Physician Assistant

## 2019-09-21 ENCOUNTER — Encounter: Payer: Self-pay | Admitting: Physician Assistant

## 2019-09-21 ENCOUNTER — Other Ambulatory Visit: Payer: Self-pay

## 2019-09-21 VITALS — Ht 66.0 in | Wt 220.8 lb

## 2019-09-21 DIAGNOSIS — I25110 Atherosclerotic heart disease of native coronary artery with unstable angina pectoris: Secondary | ICD-10-CM

## 2019-09-21 DIAGNOSIS — I10 Essential (primary) hypertension: Secondary | ICD-10-CM

## 2019-09-21 DIAGNOSIS — E785 Hyperlipidemia, unspecified: Secondary | ICD-10-CM

## 2019-09-21 NOTE — Patient Instructions (Signed)
Medication Instructions:  Your physician recommends that you continue on your current medications as directed. Please refer to the Current Medication list given to you today.  *If you need a refill on your cardiac medications before your next appointment, please call your pharmacy*  Lab Work: None ordered  If you have labs (blood work) drawn today and your tests are completely normal, you will receive your results only by: . MyChart Message (if you have MyChart) OR . A paper copy in the mail If you have any lab test that is abnormal or we need to change your treatment, we will call you to review the results.  Testing/Procedures: None ordered  Follow-Up: At [facility], you and your health needs are our priority.  As part of our continuing mission to provide you with exceptional heart care, we have created designated Provider Care Teams.  These Care Teams include your primary Cardiologist (physician) and Advanced Practice Providers (APPs -  Physician Assistants and Nurse Practitioners) who all work together to provide you with the care you need, when you need it.  Your next appointment:   12 month(s)  The format for your next appointment:   In Person  Provider:   You may see Michael Cooper, MD or one of the following Advanced Practice Providers on your designated Care Team:    Scott Weaver, PA-C  Vin Bhagat, PA-C  Janine Hammond, NP   Other Instructions   

## 2020-05-09 ENCOUNTER — Ambulatory Visit: Payer: Medicaid Other | Admitting: Orthopaedic Surgery

## 2020-05-23 ENCOUNTER — Ambulatory Visit: Payer: Medicaid Other | Admitting: Orthopaedic Surgery

## 2023-08-12 ENCOUNTER — Telehealth: Payer: Self-pay

## 2023-08-12 ENCOUNTER — Telehealth: Payer: Self-pay | Admitting: Internal Medicine

## 2023-08-12 NOTE — Telephone Encounter (Signed)
Pt scheduled to see Dr. Lynnette Caffey, 08/21/23, clearance will be addressed at that time.  Will route to requesting surgeon's office to make them aware.

## 2023-08-12 NOTE — Telephone Encounter (Signed)
1st attempt to reach pt regarding surgical clearance and the need for an IN OFFICE appointment.  Left detailed message for pt to call back and the operator that answers will be able to schedule appointment.

## 2023-08-12 NOTE — Telephone Encounter (Signed)
   Name: Deanna Hogan  DOB: 02-16-1961  MRN: [id number]  Primary Cardiologist: Tonny Bollman, MD  Chart reviewed as part of pre-operative protocol coverage. Because of Deanna Hogan past medical history and time since last visit, she will require a follow-up in-office visit in order to better assess preoperative cardiovascular risk.  Pre-op covering staff: - Please schedule appointment and call patient to inform them. If patient already had an upcoming appointment within acceptable timeframe, please add "pre-op clearance" to the appointment notes so provider is aware. - Please contact requesting surgeon's office via preferred method (i.e, phone, fax) to inform them of need for appointment prior to surgery.   Napoleon Form, Leodis Rains, NP  08/12/2023, 9:03 AM

## 2023-08-12 NOTE — Progress Notes (Unsigned)
Cardiology Office Note:   Date:  08/12/2023  ID:  Deanna Hogan, Shoulders March 04, 1961, MRN [id number] PCP:  Mechele Claude  [facility] Providers Cardiologist:  Alverda Skeans, MD Referring MD: [name], PA-C  Chief Complaint/Reason for Referral: Preoperative cardiovascular evaluation. ASSESSMENT:    1. Preoperative cardiovascular examination   2. Coronary artery disease involving native coronary artery of native heart without angina pectoris   3. Hyperlipidemia LDL goal <55   4. Primary hypertension   5. Stenosis of right carotid artery   6. CKD (chronic kidney disease) stage 2, GFR 60-89 ml/min   7. BMI 35.0-35.9,adult     PLAN:   In order of problems listed above: Preoperative cardiovascular assessment:*** Coronary artery disease: Stop Plavix and continue aspirin monotherapy for now.  After orthopedic surgery would start Plavix and stop aspirin. Hyperlipidemia: Will check lipid panel, LFTs, LP(a) today. Hypertension:*** Carotid artery disease: Continue aggressive control of cardiovascular risk factors. CKD stage II: Continue losartan for renal protection. Elevated BMI: Diet and exercise; hopefully this can be improved after orthopedic surgery.        {Are you ordering a CV Procedure (e.g. stress test, cath, DCCV, TEE, etc)?   Press F2        :[id number]}   Dispo:  No follow-ups on file.      Medication Adjustments/Labs and Tests Ordered: Current medicines are reviewed at length with the patient today.  Concerns regarding medicines are outlined above.  The following changes have been made:  {PLAN; NO CHANGE:13088:s}   Labs/tests ordered: No orders of the defined types were placed in this encounter.   Medication Changes: No orders of the defined types were placed in this encounter.   Current medicines are reviewed at length with the patient today.  The patient {ACTIONS; HAS/DOES NOT HAVE:19233} concerns regarding medicines.  I spent *** minutes  reviewing all clinical data during and prior to this visit including all relevant imaging studies, laboratories, clinical information from other health systems and prior notes from both Cardiology and other specialties, interviewing the patient, conducting a complete physical examination, and coordinating care in order to formulate a comprehensive and personalized evaluation and treatment plan.   History of Present Illness:      FOCUSED PROBLEM LIST:   CAD Anterior STEMI DES x 2 LAD 2014 EF 50 to 55% TTE 2014 Hyperlipidemia Hypertension Intolerant of ACE inhibitors Carotid disease Less than 40% right carotid disease ultrasound 2014 BMI 35 CKD stage II   12/24: The patient is a 62 year old female with the above listed medical problems referred for preoperative cardiovascular evaluation prior to orthopedic surgery.  The patient was last in our clinic in contract with our clinic in 2021.  At that point in time she was doing well.  No changes were made to her medical regimen.          Current Medications: No outpatient medications have been marked as taking for the 08/13/23 encounter (Office Visit) with Deanna Pyo, MD.   Current Facility-Administered Medications for the 08/13/23 encounter (Office Visit) with Deanna Pyo, MD  Medication   nitroGLYCERIN (NITROSTAT) SL tablet 0.4 mg     Review of Systems:   Please see the history of present illness.    All other systems reviewed and are negative.     EKGs/Labs/Other Test Reviewed:   EKG: EKG performed 2021 demonstrates normal sinus rhythm and nonspecific T wave changes  EKG Interpretation Date/Time:    Ventricular Rate:  PR Interval:    QRS Duration:    QT Interval:    QTC Calculation:   R Axis:      Text Interpretation:           Risk Assessment/Calculations:   {Does this patient have ATRIAL FIBRILLATION?[phone]}      Physical Exam:   VS:  LMP 04/21/2013    No BP recorded.  {Refresh Note OR  Click here to enter BP  :1}***   Wt Readings from Last 3 Encounters:  09/21/19 220 lb 12.8 oz (100.2 kg)  09/06/19 220 lb 12.8 oz (100.2 kg)  05/18/18 222 lb 6.4 oz (100.9 kg)      GENERAL:  No apparent distress, AOx3 HEENT:  No carotid bruits, +2 carotid impulses, no scleral icterus CAR: RRR Irregular RR*** no murmurs***, gallops, rubs, or thrills RES:  Clear to auscultation bilaterally ABD:  Soft, nontender, nondistended, positive bowel sounds x 4 VASC:  +2 radial pulses, +2 carotid pulses NEURO:  CN 2-12 grossly intact; motor and sensory grossly intact PSYCH:  No active depression or anxiety EXT:  No edema, ecchymosis, or cyanosis  Signed, Deanna Pyo, MD  08/12/2023 11:42 AM    [facility] [address] [place], [place], [place]  [address] Phone: [phone]; Fax: [phone]   Note:  This document was prepared using Dragon voice recognition software and may include unintentional dictation errors.

## 2023-08-12 NOTE — Telephone Encounter (Signed)
...     Pre-operative Risk Assessment    Patient Name: Deanna Hogan  DOB: Jan 04, 1961 MRN: [id number]      Request for Surgical Clearance    Procedure:   ERAS RIGHT TOTAL HIP ARTHROPLASTY ANTERIOR APPROACH  Date of Surgery:  Clearance TBD                                 Surgeon:  DR Waylan Boga Surgeon's Group or Practice Name:  [facility] Phone number:  [phone] Fax number:  [phone]   Type of Clearance Requested:   - Medical  - Pharmacy:  Hold Aspirin and Clopidogrel (Plavix)     Type of Anesthesia:  Not Indicated   Additional requests/questions:   LAST O/V 09/21/19, NO NEW APPT  Signed, Renee Ramus   08/12/2023, 8:57 AM

## 2023-08-12 NOTE — Telephone Encounter (Signed)
Patient returned RMA's call to schedule preop appointment. Due to patient last being seen in 2021 a NP appointment was setup for tomorrow, 12/11 with Dr. Lynnette Caffey at 8:20 am. Per the patient her surgery is scheduled for 08/21/23.

## 2023-08-13 ENCOUNTER — Ambulatory Visit: Payer: Medicaid Other | Admitting: Internal Medicine

## 2023-08-13 NOTE — Progress Notes (Signed)
Cardiology Office Note:  .   Date:  08/14/2023  ID:  Deanna Hogan, DOB Jan 14, 1961, MRN [id number] PCP: Mechele Claude  [facility] Providers Cardiologist:  Tonny Bollman, MD { History of Present Illness: Deanna Hogan is a 62 y.o. female with history of CAD, HTN, HLD who presents for the evaluation of preoperative assessment at the request of [name], PA-C.   History of Present Illness   Miss Brooker, a 62 year old with a history of CAD, hypertension, and ischemic cardiomyopathy with a recovered ejection fraction, presents for a preoperative assessment. She is scheduled for a total hip replacement by Dr. Waylan Boga from [facility]. Her primary care physician is Dr. Murrell Converse, who manages her cholesterol. She was recently hospitalized due to COVID-19. She has a history of a heart attack in 2014, which was managed with a stent. She was informed that there was no damage to her heart. She has been treated for hypertension and is currently on aspirin. She denies being diabetic and has no history of stroke. She has not had any other surgeries besides the heart stent. She has a family history of heart disease from both parents. She is a non-smoker and rarely consumes alcohol. She is not currently working and was previously in [employer]. 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 She is a widow with three children and one grandchild. She is currently on aspirin, Lipitor, hydrochlorothiazide, losartan, and venlafaxine. She has nitroglycerin available if needed but does not regularly need it. She denies any chest pain or trouble breathing. Her activity is currently limited due to her hip pain. She hopes to be able to exercise again after her hip replacement.          Problem List CAD -anterior MI 2014 -> PCI LAD Ischemic CM -EF recovery 50-55% 11/2012 HLD HTN    ROS: All other ROS reviewed and negative. Pertinent positives noted in the  HPI.     Studies Reviewed: [name]   EKG Interpretation Date/Time:  Friday August 14 2023 13:51:10 EST Ventricular Rate:  72 PR Interval:  148 QRS Duration:  84 QT Interval:  398 QTC Calculation: 435 R Axis:   1  Text Interpretation: Normal sinus rhythm with sinus arrhythmia Normal ECG Confirmed by Lennie Odor [phone]) on 08/14/2023 1:54:05 PM   Physical Exam:   VS:  BP (!) 138/90   Pulse 72   Ht 5\' 4"  (1.626 m)   Wt 209 lb (94.8 kg)   LMP 04/21/2013   SpO2 100%   BMI 35.87 kg/m    Wt Readings from Last 3 Encounters:  08/14/23 209 lb (94.8 kg)  09/21/19 220 lb 12.8 oz (100.2 kg)  09/06/19 220 lb 12.8 oz (100.2 kg)    GEN: Well nourished, well developed in no acute distress NECK: No JVD; No carotid bruits CARDIAC: RRR, no murmurs, rubs, gallops RESPIRATORY:  Clear to auscultation without rales, wheezing or rhonchi  ABDOMEN: Soft, non-tender, non-distended EXTREMITIES:  No edema; No deformity  ASSESSMENT AND PLAN: .   Assessment and Plan    Preoperative Assessment for Total Hip Replacement No symptoms of angina or dyspnea with activity. EKG shows normal sinus rhythm with no acute changes. - Proceed with surgery. Can complete > 4 METS without limitations.  - Hold aspirin as per surgeon's preference.  Coronary Artery Disease with Ischemic Cardiomyopathy (recovered EF in 2014) No signs of heart failure. On Lipitor 80mg . -  Request cholesterol results from primary care physician for review. - No need for echocardiogram or stress test prior to surgery.  Hypertension Well controlled on HCTZ 12.5mg  daily and Losartan 25mg  daily. - Continue current management.  Follow-up - Patient to forward cholesterol results via MyChart message. - Schedule follow-up appointment in 1 year.              Follow-up: Return in about 1 year (around 08/13/2024).   Signed, Lenna Gilford. Flora Lipps, MD, [facility]  [facility]  [address] [place], [place] [address] [phone]  2:59 PM

## 2023-08-14 ENCOUNTER — Ambulatory Visit: Payer: Medicaid Other | Attending: Cardiovascular Disease | Admitting: Cardiovascular Disease

## 2023-08-14 ENCOUNTER — Encounter: Payer: Self-pay | Admitting: Cardiovascular Disease

## 2023-08-14 VITALS — BP 138/90 | HR 72 | Ht 64.0 in | Wt 209.0 lb

## 2023-08-14 DIAGNOSIS — Z01810 Encounter for preprocedural cardiovascular examination: Secondary | ICD-10-CM | POA: Diagnosis not present

## 2023-08-14 DIAGNOSIS — E785 Hyperlipidemia, unspecified: Secondary | ICD-10-CM

## 2023-08-14 DIAGNOSIS — I2510 Atherosclerotic heart disease of native coronary artery without angina pectoris: Secondary | ICD-10-CM

## 2023-08-14 DIAGNOSIS — I10 Essential (primary) hypertension: Secondary | ICD-10-CM

## 2023-08-14 NOTE — Patient Instructions (Addendum)
Medication Instructions:  Your physician recommends that you continue on your current medications as directed. Please refer to the Current Medication list given to you today.  *If you need a refill on your cardiac medications before your next appointment, please call your pharmacy*  Lab Work: None (please have your doctor's office send [place] your Cholesterol results)  Follow-Up: At [facility], you and your health needs are our priority.  As part of our continuing mission to provide you with exceptional heart care, we have created designated Provider Care Teams.  These Care Teams include your primary Cardiologist (physician) and Advanced Practice Providers (APPs -  Physician Assistants and Nurse Practitioners) who all work together to provide you with the care you need, when you need it.  We recommend signing up for the patient portal called "MyChart".  Sign up information is provided on this After Visit Summary.  MyChart is used to connect with patients for Virtual Visits (Telemedicine).  Patients are able to view lab/test results, encounter notes, upcoming appointments, etc.  Non-urgent messages can be sent to your provider as well.   To learn more about what you can do with MyChart, go to [email or web address].    Your next appointment:   12 month(s)  Provider:   Dr. Tonny Bollman

## 2024-01-05 NOTE — Progress Notes (Signed)
 This encounter was created in error - please disregard.
